# Patient Record
Sex: Female | Born: 1980 | Race: White | Hispanic: No | Marital: Single | Smoking: Never smoker
Health system: Southern US, Community
[De-identification: ages and names within clinical notes are randomized; demographics above are authoritative.]

## PROBLEM LIST (undated history)

## (undated) DIAGNOSIS — B977 Papillomavirus as the cause of diseases classified elsewhere: Secondary | ICD-10-CM

## (undated) DIAGNOSIS — R87619 Unspecified abnormal cytological findings in specimens from cervix uteri: Secondary | ICD-10-CM

## (undated) DIAGNOSIS — F32A Depression, unspecified: Secondary | ICD-10-CM

## (undated) DIAGNOSIS — Z309 Encounter for contraceptive management, unspecified: Secondary | ICD-10-CM

## (undated) DIAGNOSIS — Z3490 Encounter for supervision of normal pregnancy, unspecified, unspecified trimester: Secondary | ICD-10-CM

## (undated) DIAGNOSIS — F329 Major depressive disorder, single episode, unspecified: Secondary | ICD-10-CM

## (undated) DIAGNOSIS — F419 Anxiety disorder, unspecified: Secondary | ICD-10-CM

## (undated) DIAGNOSIS — Z8742 Personal history of other diseases of the female genital tract: Secondary | ICD-10-CM

## (undated) DIAGNOSIS — O219 Vomiting of pregnancy, unspecified: Secondary | ICD-10-CM

## (undated) HISTORY — PX: COLPOSCOPY: SHX161

## (undated) HISTORY — DX: Encounter for contraceptive management, unspecified: Z30.9

## (undated) HISTORY — DX: Papillomavirus as the cause of diseases classified elsewhere: B97.7

## (undated) HISTORY — DX: Anxiety disorder, unspecified: F41.9

## (undated) HISTORY — DX: Depression, unspecified: F32.A

## (undated) HISTORY — DX: Vomiting of pregnancy, unspecified: O21.9

## (undated) HISTORY — DX: Encounter for supervision of normal pregnancy, unspecified, unspecified trimester: Z34.90

## (undated) HISTORY — DX: Personal history of other diseases of the female genital tract: Z87.42

## (undated) HISTORY — DX: Unspecified abnormal cytological findings in specimens from cervix uteri: R87.619

## (undated) HISTORY — DX: Major depressive disorder, single episode, unspecified: F32.9

---

## 2004-05-13 ENCOUNTER — Ambulatory Visit: Payer: Self-pay

## 2011-03-08 ENCOUNTER — Emergency Department (HOSPITAL_COMMUNITY)
Admission: EM | Admit: 2011-03-08 | Discharge: 2011-03-08 | Disposition: A | Discharge status: Home / Self Care | Payer: Medicaid Other

## 2011-03-08 NOTE — ED Notes (Signed)
No answer to call x2 

## 2011-06-05 ENCOUNTER — Emergency Department (HOSPITAL_COMMUNITY)
Admission: EM | Admit: 2011-06-05 | Discharge: 2011-06-05 | Disposition: A | Discharge status: Home / Self Care | Payer: Medicaid Other | Attending: Emergency Medicine | Admitting: Emergency Medicine

## 2011-06-05 DIAGNOSIS — H6691 Otitis media, unspecified, right ear: Secondary | ICD-10-CM

## 2011-06-05 DIAGNOSIS — H6690 Otitis media, unspecified, unspecified ear: Secondary | ICD-10-CM | POA: Insufficient documentation

## 2011-06-05 MED ORDER — CEFTRIAXONE SODIUM 1 G IJ SOLR
1.0000 g | Freq: Once | INTRAMUSCULAR | Status: AC
  Administered 2011-06-05: 1 g via INTRAMUSCULAR
  Filled 2011-06-05: qty 10

## 2011-06-05 MED ORDER — AMOXICILLIN 500 MG PO CAPS: 500.0000 mg | ORAL_CAPSULE | Freq: Three times a day (TID) | ORAL | Status: AC

## 2011-06-05 NOTE — ED Provider Notes (Signed)
History     CSN: [id number] Arrival date & time: 06/05/2011  5:14 PM   First MD Initiated Contact with Patient 06/05/11 1757      Chief Complaint  Patient presents with  . Sore Throat    (Consider location/radiation/quality/duration/timing/severity/associated sxs/prior treatment) HPI Comments: sxs began with sore throat but now are primarily R ear pain.  Patient is a 30 y.o. female presenting with pharyngitis. The history is provided by the patient. No language interpreter was used.  Sore Throat This is a new problem. The current episode started in the past 7 days. The problem occurs constantly. The problem has been gradually worsening. Associated symptoms include coughing and a sore throat. Pertinent negatives include no chills, fever, nausea or vomiting. Associated symptoms comments: R ear pain. The symptoms are aggravated by coughing and swallowing. She has tried acetaminophen for the symptoms. The treatment provided no relief.    History reviewed. No pertinent past medical history.  History reviewed. No pertinent past surgical history.  No family history on file.  History  Substance Use Topics  . Smoking status: Never Smoker   . Smokeless tobacco: Not on file  . Alcohol Use: No    OB History    Grav Para Term Preterm Abortions TAB SAB Ect Mult Living                  Review of Systems  Constitutional: Negative for fever and chills.  HENT: Positive for ear pain and sore throat.   Respiratory: Positive for cough.   Gastrointestinal: Negative for nausea, vomiting and diarrhea.  All other systems reviewed and are negative.    Allergies  Review of patient's allergies indicates no known allergies.  Home Medications  No current outpatient prescriptions on file.  BP 132/78  Pulse 103  Temp(Src) 97.3 F (36.3 C) (Oral)  Resp 20  Ht 5\' 2"  (1.575 m)  Wt 138 lb (62.596 kg)  BMI 25.24 kg/m2  SpO2 100%  LMP 05/12/2011  Physical Exam  Nursing note and vitals  reviewed. Constitutional: She is oriented to person, place, and time. She appears well-developed and well-nourished. No distress.  HENT:  Head: Normocephalic and atraumatic.  Right Ear: External ear and ear canal normal. No drainage, swelling or tenderness. Tympanic membrane is injected and bulging. A middle ear effusion is present. Decreased hearing is noted.  Left Ear: Hearing, tympanic membrane, external ear and ear canal normal.  Mouth/Throat: No oropharyngeal exudate.       R TM is bulging and injected.  No landmarks are visible.  Hearing is decreased.  Eyes: EOM are normal.  Neck: Normal range of motion.  Cardiovascular: Normal rate, regular rhythm and normal heart sounds.   Pulmonary/Chest: Effort normal and breath sounds normal. No respiratory distress. She has no wheezes. She has no rales. She exhibits no tenderness.  Abdominal: Soft. She exhibits no distension. There is no tenderness.  Musculoskeletal: Normal range of motion.  Lymphadenopathy:    She has cervical adenopathy.  Neurological: She is alert and oriented to person, place, and time.  Skin: Skin is warm and dry.  Psychiatric: She has a normal mood and affect. Judgment normal.    ED Course  Procedures (including critical care time)  Labs Reviewed - No data to display No results found.   No diagnosis found.    MDM          Worthy Rancher, PA 06/05/11 [phone]

## 2011-06-05 NOTE — ED Notes (Signed)
Pt a/ox4. Resp even and unlabored. NAD at this time. D/C instructions and Rx x1 reviewed with pt. Pt verbalized understanding. Pt ambulated to POV with steady gate.  

## 2011-06-05 NOTE — ED Provider Notes (Signed)
Medical screening examination/treatment/procedure(s) were performed by non-physician practitioner and as supervising physician I was immediately available for consultation/collaboration.  Donnetta Hutching, MD 06/05/11 [phone]

## 2011-06-05 NOTE — ED Notes (Signed)
Pt c/o sore throat, r earache, and hoarseness since Monday.  Reports a little cough.  Unsure if has had fever.

## 2012-08-23 ENCOUNTER — Other Ambulatory Visit (HOSPITAL_COMMUNITY): Payer: Self-pay | Admitting: Urology

## 2012-08-23 DIAGNOSIS — N390 Urinary tract infection, site not specified: Secondary | ICD-10-CM

## 2012-08-25 ENCOUNTER — Ambulatory Visit (HOSPITAL_COMMUNITY)
Admission: RE | Admit: 2012-08-25 | Discharge: 2012-08-25 | Disposition: A | Discharge status: Home / Self Care | Payer: Medicaid Other | Source: Ambulatory Visit | Attending: Urology | Admitting: Urology

## 2012-08-25 DIAGNOSIS — N390 Urinary tract infection, site not specified: Secondary | ICD-10-CM | POA: Insufficient documentation

## 2013-07-31 ENCOUNTER — Ambulatory Visit (INDEPENDENT_AMBULATORY_CARE_PROVIDER_SITE_OTHER): Payer: Medicaid Other | Admitting: Adult Health

## 2013-07-31 ENCOUNTER — Encounter: Payer: Self-pay | Admitting: Adult Health

## 2013-07-31 ENCOUNTER — Other Ambulatory Visit (HOSPITAL_COMMUNITY)
Admission: RE | Admit: 2013-07-31 | Discharge: 2013-07-31 | Disposition: A | Discharge status: Home / Self Care | Payer: Medicaid Other | Source: Ambulatory Visit | Attending: Adult Health | Admitting: Adult Health

## 2013-07-31 VITALS — BP 116/72 | HR 74 | Ht 62.0 in | Wt 145.0 lb

## 2013-07-31 DIAGNOSIS — Z8742 Personal history of other diseases of the female genital tract: Secondary | ICD-10-CM

## 2013-07-31 DIAGNOSIS — Z1151 Encounter for screening for human papillomavirus (HPV): Secondary | ICD-10-CM | POA: Insufficient documentation

## 2013-07-31 DIAGNOSIS — Z309 Encounter for contraceptive management, unspecified: Secondary | ICD-10-CM

## 2013-07-31 DIAGNOSIS — Z01419 Encounter for gynecological examination (general) (routine) without abnormal findings: Secondary | ICD-10-CM

## 2013-07-31 DIAGNOSIS — R87810 Cervical high risk human papillomavirus (HPV) DNA test positive: Secondary | ICD-10-CM | POA: Insufficient documentation

## 2013-07-31 DIAGNOSIS — Z0000 Encounter for general adult medical examination without abnormal findings: Secondary | ICD-10-CM

## 2013-07-31 DIAGNOSIS — Z124 Encounter for screening for malignant neoplasm of cervix: Secondary | ICD-10-CM | POA: Insufficient documentation

## 2013-07-31 HISTORY — DX: Encounter for contraceptive management, unspecified: Z30.9

## 2013-07-31 HISTORY — DX: Personal history of other diseases of the female genital tract: Z87.42

## 2013-07-31 MED ORDER — NORGESTIM-ETH ESTRAD TRIPHASIC 0.18/0.215/0.25 MG-25 MCG PO TABS: 1.0000 | ORAL_TABLET | Freq: Every day | ORAL | Status: DC

## 2013-07-31 NOTE — Progress Notes (Signed)
Patient ID: Ronna PolioJennifer Remo, female   DOB: April 02, 1981, 33 y.o.   MRN: [id number] History of Present Illness: Victorino DikeJennifer is a 33 year old white female, single in for a pap and physical.   Current Medications, Allergies, Past Medical History, Past Surgical History, Family History and Social History were reviewed in [facility] electronic medical record.   Past Medical History  Diagnosis Date  . Abnormal Pap smear of cervix   . HPV (human papilloma virus) infection   . History of abnormal cervical Pap smear 07/31/2013  . Contraceptive management 07/31/2013   Past Surgical History  Procedure Laterality Date  . Colposcopy    Current outpatient prescriptions:Norgestimate-Ethinyl Estradiol Triphasic (ORTHO TRI-CYCLEN LO) 0.18/0.215/0.25 MG-25 MCG tab, Take 1 tablet by mouth daily., Disp: 1 Package, Rfl: 11  Review of Systems: Patient denies any  blurred vision, shortness of breath, chest pain, abdominal pain, problems with bowel movements, urination, or intercourse. Not having sex at present, no joint pain or mood changes, does have headaches often, this is chronic and she uses Maxalt if bad.She is happy with her OCs. Had abnormal pap in 2011 with colpo, showing HPV effect, no pap since.   Physical Exam:BP 116/72  Pulse 74  Ht 5\' 2"  (1.575 m)  Wt 145 lb (65.772 kg)  BMI 26.51 kg/m2  LMP 07/10/2013 General:  Well developed, well nourished, no acute distress Skin:  Warm and dry, tan with lots of tattoos Neck:  Midline trachea, normal thyroid Lungs; Clear to auscultation bilaterally Breast:  No dominant palpable mass, retraction, or nipple discharge Cardiovascular: Regular rate and rhythm Abdomen:  Soft, non tender, no hepatosplenomegaly Pelvic:  External genitalia is normal in appearance.  The vagina is normal in appearance. The cervix is bulbous and friable with EC brush, pap with HPV performed.  Uterus is felt to be normal size, shape, and contour.  No   adnexal masses or tenderness  noted. Extremities:  No swelling or varicosities noted Psych:  No mood changes, alert and cooperative, seems happy   Impression: Yearly gyn exam  History of abnormal pap Contraceptive management    Plan: Refilled ortho tri cyclen lo x 1 year Return in 1 year of physical Declines labs

## 2013-07-31 NOTE — Patient Instructions (Signed)
Physical in 1 year Follow up prn

## 2013-08-07 ENCOUNTER — Telehealth: Payer: Self-pay | Admitting: Adult Health

## 2013-08-07 NOTE — Telephone Encounter (Signed)
Pt aware pap came back abnormal and needs colpo, appt made

## 2013-08-15 ENCOUNTER — Encounter: Payer: Self-pay | Admitting: Obstetrics and Gynecology

## 2013-08-15 ENCOUNTER — Ambulatory Visit (INDEPENDENT_AMBULATORY_CARE_PROVIDER_SITE_OTHER): Payer: Medicaid Other | Admitting: Obstetrics and Gynecology

## 2013-08-15 ENCOUNTER — Other Ambulatory Visit: Payer: Self-pay | Admitting: Obstetrics and Gynecology

## 2013-08-15 VITALS — BP 110/76 | Ht 62.0 in | Wt 140.0 lb

## 2013-08-15 DIAGNOSIS — R87810 Cervical high risk human papillomavirus (HPV) DNA test positive: Secondary | ICD-10-CM

## 2013-08-15 DIAGNOSIS — N870 Mild cervical dysplasia: Secondary | ICD-10-CM

## 2013-08-15 NOTE — Patient Instructions (Signed)
Colposcopy Care After Colposcopy is a procedure in which a special tool is used to magnify the surface of the cervix. A tissue sample (biopsy) may also be taken. This sample will be looked at for cervical cancer or other problems. After the test:  You may have some cramping.  Lie down for a few minutes if you feel lightheaded.   You may have some bleeding which should stop in a few days. HOME CARE  Do not have sex or use tampons for 2 to 3 days or as told.  Only take medicine as told by your doctor.  Continue to take your birth control pills as usual. Finding out the results of your test Ask when your test results will be ready. Make sure you get your test results. GET HELP RIGHT AWAY IF:  You are bleeding a lot or are passing blood clots.  You develop a fever of 102 F (38.9 C) or higher.  You have abnormal vaginal discharge.  You have cramps that do not go away with medicine.  You feel lightheaded, dizzy, or pass out (faint). MAKE SURE YOU:   Understand these instructions.  Will watch your condition.  Will get help right away if you are not doing well or get worse. Document Released: 12/22/2007 Document Revised: 09/27/2011 Document Reviewed: 02/01/2013 ExitCare Patient Information 2014 [employer].  

## 2013-08-15 NOTE — Progress Notes (Signed)
This chart was scribed by Bennett Scrapehristina Taylor, Medical Scribe, for Dr. Christin BachJohn Pelagia Iacobucci on 08/15/13 at 11:07 AM. This chart was reviewed by Dr. Christin BachJohn Shaqueena Mauceri and is accurate.   Ronna PolioJennifer Labrecque 32 y.o. [id number] here for colposcopy for ASCUS with POSITIVE high risk HPV pap smear on 07/31/13. H/O one cervix cryotherapy and one cervical cauterization 6 years ago. G2P1A1, living child was a vaginal birth. She is unsure about having more children. Discussed role for HPV in cervical dysplasia, need for surveillance.  Patient given informed consent, signed copy in the chart, time out was performed.  Placed in lithotomy position. Cervix viewed with speculum and colposcope after application of acetic acid. Chaperone present. Pt handled procedure with no discomfort.   Colposcopy adequate? Yes  white punctation noted at 12 o'clock; biopsies obtained at 12 o'clock.   ECC specimen obtained. All specimens were labelled and sent to pathology.  Colposcopy IMPRESSION: CIN 1 anteriorly   Patient was given post procedure instructions. Will follow up pathology and manage accordingly.  Routine preventative health maintenance measures emphasized.

## 2013-08-16 ENCOUNTER — Encounter: Payer: Self-pay | Admitting: Obstetrics and Gynecology

## 2013-08-17 ENCOUNTER — Telehealth: Payer: Self-pay | Admitting: *Deleted

## 2013-08-17 NOTE — Telephone Encounter (Signed)
Message copied by Richardson ChiquitoRAVIS, Joshia Kitchings M on Fri Aug 17, 2013  1:36 PM ------      Message from: Tilda BurrowFERGUSON, JOHN V      Created: Thu Aug 16, 2013  5:19 PM       Pathology shows LOW GRADE CERVICAL ABNORMALITIES ,(LSIL), WHICH CAN BE FOLLOWED BY REPEAT PAP IN 1 YEAR. ------

## 2013-08-21 NOTE — Telephone Encounter (Signed)
Attempted to contact patient but voicemail not set up.

## 2013-08-24 NOTE — Telephone Encounter (Signed)
Pt aware to follow up in 1 year for repeat pap

## 2013-09-14 ENCOUNTER — Ambulatory Visit: Payer: Medicaid Other | Admitting: Obstetrics and Gynecology

## 2014-05-20 ENCOUNTER — Encounter: Payer: Self-pay | Admitting: Obstetrics and Gynecology

## 2014-08-26 ENCOUNTER — Other Ambulatory Visit: Payer: Self-pay | Admitting: *Deleted

## 2014-08-26 MED ORDER — NORGESTIM-ETH ESTRAD TRIPHASIC 0.18/0.215/0.25 MG-25 MCG PO TABS: 1.0000 | ORAL_TABLET | Freq: Every day | ORAL | Status: DC

## 2015-07-20 NOTE — L&D Delivery Note (Signed)
Delivery Note At 3:14 PM a viable female was delivered via Vaginal, Spontaneous Delivery (Presentation:vertex ;LOA  ).  APGAR: 8,9 ; weight  .   Placenta status:spont ,shultz .  Cord:3 vc  with the following complications:none .  Cord pH: n/a  Anesthesia:  epidural Episiotomy: None Lacerations: None Suture Repair: n/a Est. Blood Loss (mL): 50  Mom to postpartum.  Baby to Couplet care / Skin to Skin.  Susan Salinas 05/30/2016, 3:24 PM

## 2015-10-01 ENCOUNTER — Other Ambulatory Visit (HOSPITAL_COMMUNITY): Payer: Self-pay | Admitting: Sports Medicine

## 2015-10-01 DIAGNOSIS — R1013 Epigastric pain: Secondary | ICD-10-CM

## 2015-10-06 ENCOUNTER — Ambulatory Visit (HOSPITAL_COMMUNITY): Admission: RE | Admit: 2015-10-06 | Payer: Medicaid Other | Source: Ambulatory Visit

## 2015-10-08 ENCOUNTER — Ambulatory Visit (INDEPENDENT_AMBULATORY_CARE_PROVIDER_SITE_OTHER): Payer: Medicaid Other | Admitting: Adult Health

## 2015-10-08 ENCOUNTER — Encounter: Payer: Self-pay | Admitting: Adult Health

## 2015-10-08 VITALS — BP 118/80 | HR 80 | Ht 61.5 in | Wt 139.0 lb

## 2015-10-08 DIAGNOSIS — N926 Irregular menstruation, unspecified: Secondary | ICD-10-CM | POA: Diagnosis not present

## 2015-10-08 DIAGNOSIS — Z3201 Encounter for pregnancy test, result positive: Secondary | ICD-10-CM

## 2015-10-08 DIAGNOSIS — O219 Vomiting of pregnancy, unspecified: Secondary | ICD-10-CM

## 2015-10-08 DIAGNOSIS — Z3490 Encounter for supervision of normal pregnancy, unspecified, unspecified trimester: Secondary | ICD-10-CM

## 2015-10-08 DIAGNOSIS — F32A Depression, unspecified: Secondary | ICD-10-CM

## 2015-10-08 DIAGNOSIS — O3680X0 Pregnancy with inconclusive fetal viability, not applicable or unspecified: Secondary | ICD-10-CM

## 2015-10-08 DIAGNOSIS — F329 Major depressive disorder, single episode, unspecified: Secondary | ICD-10-CM | POA: Diagnosis not present

## 2015-10-08 HISTORY — DX: Encounter for supervision of normal pregnancy, unspecified, unspecified trimester: Z34.90

## 2015-10-08 HISTORY — DX: Vomiting of pregnancy, unspecified: O21.9

## 2015-10-08 LAB — POCT URINE PREGNANCY: PREG TEST UR: POSITIVE — AB

## 2015-10-08 MED ORDER — PRENATAL PLUS 27-1 MG PO TABS: 1.0000 | ORAL_TABLET | Freq: Every day | ORAL | Status: DC

## 2015-10-08 MED ORDER — DOXYLAMINE-PYRIDOXINE 10-10 MG PO TBEC: DELAYED_RELEASE_TABLET | ORAL | Status: DC

## 2015-10-08 NOTE — Progress Notes (Signed)
Subjective:     Patient ID: Susan PolioJennifer Salinas, female   DOB: Apr 22, 1981, 35 y.o.   MRN: [id number]  HPI Victorino DikeJennifer is a 35 year old white female in for UPT, has nausea and some vomiting. She is on Luvox and has been on meds for depression since she was 16.   Review of Systems Patient denies any headaches, hearing loss, fatigue, blurred vision, shortness of breath, chest pain, abdominal pain, problems with bowel movements, urination, or intercourse. No joint pain or mood swings.See HPI for positives. Reviewed past medical,surgical, social and family history. Reviewed medications and allergies.     Objective:   Physical Exam BP 118/80 mmHg  Pulse 80  Ht 5' 1.5" (1.562 m)  Wt 139 lb (63.05 kg)  BMI 25.84 kg/m2  LMP 09/02/2015 UPT +, about 5+1 week by LMP with EDD 06/08/16.Skin warm and dry. Neck: mid line trachea, normal thyroid, good ROM, no lymphadenopathy noted. Lungs: clear to ausculation bilaterally. Cardiovascular: regular rate and rhythm.Abdomen soft and non tender, discussed with Dr Emelda FearFerguson that she is on Luvox and he said can continue for now, it is cat.C, and then D in third trimester.Will give diclegis to try.    Assessment:     +UPT Pregnant Nausea and vomiting Depression     Plan:    Eat often, try gum and hard candy  Rx prenatal plus #30 take 1 daily with 11 refills Rx diclegis 10 mg #48 take as directed, samples given lot [id number] exp 04-17-17 Review handout on first trimester Can take luvox for now, but will change to other SSRI at about 20 weeks

## 2015-10-08 NOTE — Patient Instructions (Signed)
First Trimester of Pregnancy The first trimester of pregnancy is from week 1 until the end of week 12 (months 1 through 3). A week after a sperm fertilizes an egg, the egg will implant on the wall of the uterus. This embryo will begin to develop into a baby. Genes from you and your partner are forming the baby. The female genes determine whether the baby is a boy or a girl. At 6-8 weeks, the eyes and face are formed, and the heartbeat can be seen on ultrasound. At the end of 12 weeks, all the baby's organs are formed.  Now that you are pregnant, you will want to do everything you can to have a healthy baby. Two of the most important things are to get good prenatal care and to follow your health care provider's instructions. Prenatal care is all the medical care you receive before the baby's birth. This care will help prevent, find, and treat any problems during the pregnancy and childbirth. BODY CHANGES Your body goes through many changes during pregnancy. The changes vary from woman to woman.   You may gain or lose a couple of pounds at first.  You may feel sick to your stomach (nauseous) and throw up (vomit). If the vomiting is uncontrollable, call your health care provider.  You may tire easily.  You may develop headaches that can be relieved by medicines approved by your health care provider.  You may urinate more often. Painful urination may mean you have a bladder infection.  You may develop heartburn as a result of your pregnancy.  You may develop constipation because certain hormones are causing the muscles that push waste through your intestines to slow down.  You may develop hemorrhoids or swollen, bulging veins (varicose veins).  Your breasts may begin to grow larger and become tender. Your nipples may stick out more, and the tissue that surrounds them (areola) may become darker.  Your gums may bleed and may be sensitive to brushing and flossing.  Dark spots or blotches (chloasma,  mask of pregnancy) may develop on your face. This will likely fade after the baby is born.  Your menstrual periods will stop.  You may have a loss of appetite.  You may develop cravings for certain kinds of food.  You may have changes in your emotions from day to day, such as being excited to be pregnant or being concerned that something may go wrong with the pregnancy and baby.  You may have more vivid and strange dreams.  You may have changes in your hair. These can include thickening of your hair, rapid growth, and changes in texture. Some women also have hair loss during or after pregnancy, or hair that feels dry or thin. Your hair will most likely return to normal after your baby is born. WHAT TO EXPECT AT YOUR PRENATAL VISITS During a routine prenatal visit:  You will be weighed to make sure you and the baby are growing normally.  Your blood pressure will be taken.  Your abdomen will be measured to track your baby's growth.  The fetal heartbeat will be listened to starting around week 10 or 12 of your pregnancy.  Test results from any previous visits will be discussed. Your health care provider may ask you:  How you are feeling.  If you are feeling the baby move.  If you have had any abnormal symptoms, such as leaking fluid, bleeding, severe headaches, or abdominal cramping.  If you are using any tobacco products,   including cigarettes, chewing tobacco, and electronic cigarettes.  If you have any questions. Other tests that may be performed during your first trimester include:  Blood tests to find your blood type and to check for the presence of any previous infections. They will also be used to check for low iron levels (anemia) and Rh antibodies. Later in the pregnancy, blood tests for diabetes will be done along with other tests if problems develop.  Urine tests to check for infections, diabetes, or protein in the urine.  An ultrasound to confirm the proper growth  and development of the baby.  An amniocentesis to check for possible genetic problems.  Fetal screens for spina bifida and Down syndrome.  You may need other tests to make sure you and the baby are doing well.  HIV (human immunodeficiency virus) testing. Routine prenatal testing includes screening for HIV, unless you choose not to have this test. HOME CARE INSTRUCTIONS  Medicines  Follow your health care provider's instructions regarding medicine use. Specific medicines may be either safe or unsafe to take during pregnancy.  Take your prenatal vitamins as directed.  If you develop constipation, try taking a stool softener if your health care provider approves. Diet  Eat regular, well-balanced meals. Choose a variety of foods, such as meat or vegetable-based protein, fish, milk and low-fat dairy products, vegetables, fruits, and whole grain breads and cereals. Your health care provider will help you determine the amount of weight gain that is right for you.  Avoid raw meat and uncooked cheese. These carry germs that can cause birth defects in the baby.  Eating four or five small meals rather than three large meals a day may help relieve nausea and vomiting. If you start to feel nauseous, eating a few soda crackers can be helpful. Drinking liquids between meals instead of during meals also seems to help nausea and vomiting.  If you develop constipation, eat more high-fiber foods, such as fresh vegetables or fruit and whole grains. Drink enough fluids to keep your urine clear or pale yellow. Activity and Exercise  Exercise only as directed by your health care provider. Exercising will help you:  Control your weight.  Stay in shape.  Be prepared for labor and delivery.  Experiencing pain or cramping in the lower abdomen or low back is a good sign that you should stop exercising. Check with your health care provider before continuing normal exercises.  Try to avoid standing for long  periods of time. Move your legs often if you must stand in one place for a long time.  Avoid heavy lifting.  Wear low-heeled shoes, and practice good posture.  You may continue to have sex unless your health care provider directs you otherwise. Relief of Pain or Discomfort  Wear a good support bra for breast tenderness.   Take warm sitz baths to soothe any pain or discomfort caused by hemorrhoids. Use hemorrhoid cream if your health care provider approves.   Rest with your legs elevated if you have leg cramps or low back pain.  If you develop varicose veins in your legs, wear support hose. Elevate your feet for 15 minutes, 3-4 times a day. Limit salt in your diet. Prenatal Care  Schedule your prenatal visits by the twelfth week of pregnancy. They are usually scheduled monthly at first, then more often in the last 2 months before delivery.  Write down your questions. Take them to your prenatal visits.  Keep all your prenatal visits as directed by your   health care provider. Safety  Wear your seat belt at all times when driving.  Make a list of emergency phone numbers, including numbers for family, friends, the hospital, and police and fire departments. General Tips  Ask your health care provider for a referral to a local prenatal education class. Begin classes no later than at the beginning of month 6 of your pregnancy.  Ask for help if you have counseling or nutritional needs during pregnancy. Your health care provider can offer advice or refer you to specialists for help with various needs.  Do not use hot tubs, steam rooms, or saunas.  Do not douche or use tampons or scented sanitary pads.  Do not cross your legs for long periods of time.  Avoid cat litter boxes and soil used by cats. These carry germs that can cause birth defects in the baby and possibly loss of the fetus by miscarriage or stillbirth.  Avoid all smoking, herbs, alcohol, and medicines not prescribed by  your health care provider. Chemicals in these affect the formation and growth of the baby.  Do not use any tobacco products, including cigarettes, chewing tobacco, and electronic cigarettes. If you need help quitting, ask your health care provider. You may receive counseling support and other resources to help you quit.  Schedule a dentist appointment. At home, brush your teeth with a soft toothbrush and be gentle when you floss. SEEK MEDICAL CARE IF:   You have dizziness.  You have mild pelvic cramps, pelvic pressure, or nagging pain in the abdominal area.  You have persistent nausea, vomiting, or diarrhea.  You have a bad smelling vaginal discharge.  You have pain with urination.  You notice increased swelling in your face, hands, legs, or ankles. SEEK IMMEDIATE MEDICAL CARE IF:   You have a fever.  You are leaking fluid from your vagina.  You have spotting or bleeding from your vagina.  You have severe abdominal cramping or pain.  You have rapid weight gain or loss.  You vomit blood or material that looks like coffee grounds.  You are exposed to [place] measles and have never had them.  You are exposed to fifth disease or chickenpox.  You develop a severe headache.  You have shortness of breath.  You have any kind of trauma, such as from a fall or a car accident.   This information is not intended to replace advice given to you by your health care provider. Make sure you discuss any questions you have with your health care provider.   Document Released: 06/29/2001 Document Revised: 07/26/2014 Document Reviewed: 05/15/2013 Elsevier Interactive Patient Education 2016 [employer] Follow up in 2 weeks for [place] Eat often, try gum or hard candy Try diclegis

## 2015-10-22 ENCOUNTER — Other Ambulatory Visit (INDEPENDENT_AMBULATORY_CARE_PROVIDER_SITE_OTHER): Payer: Medicaid Other

## 2015-10-22 ENCOUNTER — Ambulatory Visit (INDEPENDENT_AMBULATORY_CARE_PROVIDER_SITE_OTHER): Payer: Medicaid Other

## 2015-10-22 DIAGNOSIS — O3680X0 Pregnancy with inconclusive fetal viability, not applicable or unspecified: Secondary | ICD-10-CM | POA: Diagnosis not present

## 2015-10-22 DIAGNOSIS — R3915 Urgency of urination: Secondary | ICD-10-CM | POA: Diagnosis not present

## 2015-10-22 LAB — POCT URINALYSIS DIPSTICK
Glucose, UA: NEGATIVE
Ketones, UA: NEGATIVE
LEUKOCYTES UA: NEGATIVE
Nitrite, UA: POSITIVE
PROTEIN UA: NEGATIVE

## 2015-10-22 MED ORDER — CEPHALEXIN 500 MG PO CAPS: 500.0000 mg | ORAL_CAPSULE | Freq: Three times a day (TID) | ORAL | Status: DC

## 2015-10-22 NOTE — Addendum Note (Signed)
Addended by: Gaylyn RongEVANS, Shontay Wallner A on: 10/22/2015 04:38 PM   Modules accepted: Orders

## 2015-10-22 NOTE — Progress Notes (Signed)
+   nitrites.  Rx Keflex 500mg  BID X7

## 2015-10-22 NOTE — Progress Notes (Signed)
US 7+1 wks,single IUP pos fht 119 bpm,normal ov's bilat,crl 9.3 mm

## 2015-10-24 LAB — URINE CULTURE

## 2015-11-05 ENCOUNTER — Ambulatory Visit (INDEPENDENT_AMBULATORY_CARE_PROVIDER_SITE_OTHER): Payer: Medicaid Other | Admitting: Advanced Practice Midwife

## 2015-11-05 ENCOUNTER — Other Ambulatory Visit (HOSPITAL_COMMUNITY)
Admission: RE | Admit: 2015-11-05 | Discharge: 2015-11-05 | Disposition: A | Discharge status: Home / Self Care | Payer: Medicaid Other | Source: Ambulatory Visit | Attending: Advanced Practice Midwife | Admitting: Advanced Practice Midwife

## 2015-11-05 ENCOUNTER — Encounter: Payer: Self-pay | Admitting: Advanced Practice Midwife

## 2015-11-05 VITALS — BP 100/70 | HR 72 | Wt 141.0 lb

## 2015-11-05 DIAGNOSIS — Z1151 Encounter for screening for human papillomavirus (HPV): Secondary | ICD-10-CM | POA: Insufficient documentation

## 2015-11-05 DIAGNOSIS — Z331 Pregnant state, incidental: Secondary | ICD-10-CM

## 2015-11-05 DIAGNOSIS — Z3491 Encounter for supervision of normal pregnancy, unspecified, first trimester: Secondary | ICD-10-CM | POA: Diagnosis not present

## 2015-11-05 DIAGNOSIS — Z0283 Encounter for blood-alcohol and blood-drug test: Secondary | ICD-10-CM

## 2015-11-05 DIAGNOSIS — Z3682 Encounter for antenatal screening for nuchal translucency: Secondary | ICD-10-CM

## 2015-11-05 DIAGNOSIS — N870 Mild cervical dysplasia: Secondary | ICD-10-CM

## 2015-11-05 DIAGNOSIS — Z01419 Encounter for gynecological examination (general) (routine) without abnormal findings: Secondary | ICD-10-CM | POA: Diagnosis present

## 2015-11-05 DIAGNOSIS — Z369 Encounter for antenatal screening, unspecified: Secondary | ICD-10-CM

## 2015-11-05 DIAGNOSIS — Z1389 Encounter for screening for other disorder: Secondary | ICD-10-CM

## 2015-11-05 DIAGNOSIS — Z124 Encounter for screening for malignant neoplasm of cervix: Secondary | ICD-10-CM

## 2015-11-05 LAB — POCT URINALYSIS DIPSTICK
GLUCOSE UA: NEGATIVE
Ketones, UA: NEGATIVE
LEUKOCYTES UA: NEGATIVE
NITRITE UA: NEGATIVE
Protein, UA: NEGATIVE
RBC UA: NEGATIVE

## 2015-11-05 MED ORDER — DOXYLAMINE-PYRIDOXINE 10-10 MG PO TBEC: DELAYED_RELEASE_TABLET | ORAL | Status: DC

## 2015-11-05 NOTE — Progress Notes (Signed)
  Subjective:    Susan PolioJennifer Salinas is a G2P1001 5574w1d being seen today for her first obstetrical visit.  Her obstetrical history is significant for 1 term SVD w/o problems.  Pregnancy history fully reviewed.  Patient reports nausea. Diclegis samples help a lot  Filed Vitals:   11/05/15 1058  BP: 100/70  Pulse: 72  Weight: 141 lb (63.957 kg)    HISTORY: OB History  Gravida Para Term Preterm AB SAB TAB Ectopic Multiple Living  2 1 1   0  0   1    # Outcome Date GA Lbr Len/2nd Weight Sex Delivery Anes PTL Lv  2 Current           1 Term 06/23/06 4581w0d  7 lb 14 oz (3.572 kg) M Vag-Spont   Y     Past Medical History  Diagnosis Date  . Abnormal Pap smear of cervix   . HPV (human papilloma virus) infection   . History of abnormal cervical Pap smear 07/31/2013  . Contraceptive management 07/31/2013  . Depression   . Pregnant 10/08/2015  . Nausea and vomiting during pregnancy 10/08/2015   Past Surgical History  Procedure Laterality Date  . Colposcopy     Family History  Problem Relation Age of Onset  . COPD Paternal Grandmother      Exam       Pelvic Exam:    Perineum: Normal Perineum   Vulva: normal   Vagina:  normal mucosa, normal discharge, no palpable nodules   Uterus Normal, Gravid, FH: 9     Cervix: Normal..pap collected   Adnexa: Not palpable   Urinary:  urethral meatus normal    System:     Skin: normal coloration and turgor, no rashes    Neurologic: oriented, normal, normal mood   Extremities: normal strength, tone, and muscle mass   HEENT PERRLA   Mouth/Teeth mucous membranes moist, normal dentition   Neck supple and no masses   Cardiovascular: regular rate and rhythm   Respiratory:  appears well, vitals normal, no respiratory distress, acyanotic   Abdomen: soft, non-tender;  FHR: 160 US          Assessment:    Pregnancy: G2P1001 Patient Active Problem List   Diagnosis Date Noted  . Pregnant 10/08/2015  . Nausea and vomiting during pregnancy  10/08/2015  . Depression 10/08/2015  . Dysplasia of cervix, low grade (CIN 1) 08/15/2013  . History of abnormal cervical Pap smear 07/31/2013  . Contraceptive management 07/31/2013        Plan:     Initial labs (lab unable to get any blood--will collect next visit) Continue prenatal vitamins  Problem list reviewed and updated  Reviewed n/v relief measures and warning s/s to report  Diclegis rx and prior auth sent CCNC done Reviewed recommended weight gain based on pre-gravid BMI  Encouraged well-balanced diet Genetic Screening discussed Integrated Screen: requested.  Ultrasound discussed; fetal survey: requested.  Return in about 3 weeks (around 11/26/2015) for LROB, US:NT+1st IT.  CRESENZO-DISHMAN,Breslin Burklow 11/05/2015

## 2015-11-05 NOTE — Patient Instructions (Signed)

## 2015-11-07 LAB — CYTOLOGY - PAP

## 2015-11-12 ENCOUNTER — Encounter: Payer: Self-pay | Admitting: Advanced Practice Midwife

## 2015-11-18 ENCOUNTER — Other Ambulatory Visit: Payer: Self-pay | Admitting: Advanced Practice Midwife

## 2015-11-26 ENCOUNTER — Ambulatory Visit (INDEPENDENT_AMBULATORY_CARE_PROVIDER_SITE_OTHER): Payer: Medicaid Other

## 2015-11-26 ENCOUNTER — Ambulatory Visit (INDEPENDENT_AMBULATORY_CARE_PROVIDER_SITE_OTHER): Payer: Medicaid Other | Admitting: Women's Health

## 2015-11-26 ENCOUNTER — Encounter: Payer: Self-pay | Admitting: Women's Health

## 2015-11-26 VITALS — BP 118/74 | HR 80 | Wt 144.0 lb

## 2015-11-26 DIAGNOSIS — B977 Papillomavirus as the cause of diseases classified elsewhere: Secondary | ICD-10-CM

## 2015-11-26 DIAGNOSIS — F329 Major depressive disorder, single episode, unspecified: Secondary | ICD-10-CM

## 2015-11-26 DIAGNOSIS — O360110 Maternal care for anti-D [Rh] antibodies, first trimester, not applicable or unspecified: Secondary | ICD-10-CM

## 2015-11-26 DIAGNOSIS — Z3491 Encounter for supervision of normal pregnancy, unspecified, first trimester: Secondary | ICD-10-CM

## 2015-11-26 DIAGNOSIS — Z36 Encounter for antenatal screening of mother: Secondary | ICD-10-CM

## 2015-11-26 DIAGNOSIS — Z369 Encounter for antenatal screening, unspecified: Secondary | ICD-10-CM

## 2015-11-26 DIAGNOSIS — Z3682 Encounter for antenatal screening for nuchal translucency: Secondary | ICD-10-CM

## 2015-11-26 DIAGNOSIS — Z6791 Unspecified blood type, Rh negative: Secondary | ICD-10-CM | POA: Insufficient documentation

## 2015-11-26 DIAGNOSIS — O26899 Other specified pregnancy related conditions, unspecified trimester: Secondary | ICD-10-CM

## 2015-11-26 DIAGNOSIS — Z331 Pregnant state, incidental: Secondary | ICD-10-CM

## 2015-11-26 DIAGNOSIS — Z1389 Encounter for screening for other disorder: Secondary | ICD-10-CM

## 2015-11-26 DIAGNOSIS — F32A Depression, unspecified: Secondary | ICD-10-CM

## 2015-11-26 LAB — POCT URINALYSIS DIPSTICK
GLUCOSE UA: NEGATIVE
Ketones, UA: NEGATIVE
Leukocytes, UA: NEGATIVE
Nitrite, UA: NEGATIVE
Protein, UA: NEGATIVE
RBC UA: NEGATIVE

## 2015-11-26 NOTE — Progress Notes (Signed)
US 12+1 wks,measurements c/w dates,normal ov's bilat,NB present,NT 1.1 mm,crl 55.6 mm,fhr 158 bpm,ant pl gr 0

## 2015-11-26 NOTE — Progress Notes (Signed)
Low-risk OB appointment G2P1001 4332w1d Estimated Date of Delivery: 06/08/16 BP 118/74 mmHg  Pulse 80  Wt 144 lb (65.318 kg)  LMP 09/02/2015  BP, weight, and urine reviewed.  Refer to obstetrical flow sheet for FH & FHR.  No fm yet. Denies cramping, lof, vb, or uti s/s. No complaints. Reviewed today's normal nt u/s, warning s/s to report. Plan:  Continue routine obstetrical care  F/U in 4wks for OB appointment and 2nd IT 1st IT/NT today, urine cx and uds- couldn't do at new ob visit

## 2015-11-26 NOTE — Patient Instructions (Signed)

## 2015-11-27 LAB — RPR: RPR Ser Ql: NONREACTIVE

## 2015-11-27 LAB — URINALYSIS, ROUTINE W REFLEX MICROSCOPIC
BILIRUBIN UA: NEGATIVE
Glucose, UA: NEGATIVE
Ketones, UA: NEGATIVE
LEUKOCYTES UA: NEGATIVE
Nitrite, UA: NEGATIVE
PH UA: 5.5 (ref 5.0–7.5)
PROTEIN UA: NEGATIVE
RBC UA: NEGATIVE
Specific Gravity, UA: 1.028 (ref 1.005–1.030)
Urobilinogen, Ur: 0.2 mg/dL (ref 0.2–1.0)

## 2015-11-27 LAB — SICKLE CELL SCREEN: Sickle Cell Screen: NEGATIVE

## 2015-11-27 LAB — CBC
HEMATOCRIT: 38.1 % (ref 34.0–46.6)
Hemoglobin: 12.7 g/dL (ref 11.1–15.9)
MCH: 29.7 pg (ref 26.6–33.0)
MCHC: 33.3 g/dL (ref 31.5–35.7)
MCV: 89 fL (ref 79–97)
Platelets: 241 10*3/uL (ref 150–379)
RBC: 4.27 x10E6/uL (ref 3.77–5.28)
RDW: 13.6 % (ref 12.3–15.4)
WBC: 8.3 10*3/uL (ref 3.4–10.8)

## 2015-11-27 LAB — PMP SCREEN PROFILE (10S), URINE
Amphetamine Screen, Ur: NEGATIVE ng/mL
BARBITURATE SCRN UR: NEGATIVE ng/mL
BENZODIAZEPINE SCREEN, URINE: NEGATIVE ng/mL
CANNABINOIDS UR QL SCN: NEGATIVE ng/mL
CREATININE(CRT), U: 121.8 mg/dL (ref 20.0–300.0)
Cocaine(Metab.)Screen, Urine: NEGATIVE ng/mL
Methadone Scn, Ur: NEGATIVE ng/mL
OPIATE SCRN UR: NEGATIVE ng/mL
Oxycodone+Oxymorphone Ur Ql Scn: NEGATIVE ng/mL
PCP Scrn, Ur: NEGATIVE ng/mL
PH UR, DRUG SCRN: 6.6 (ref 4.5–8.9)
Propoxyphene, Screen: NEGATIVE ng/mL

## 2015-11-27 LAB — HEPATITIS B SURFACE ANTIGEN: Hepatitis B Surface Ag: NEGATIVE

## 2015-11-27 LAB — CYSTIC FIBROSIS MUTATION 97: GENE DIS ANAL CARRIER INTERP BLD/T-IMP: NOT DETECTED

## 2015-11-27 LAB — RUBELLA SCREEN: Rubella Antibodies, IGG: 6.74 index (ref >0.99)

## 2015-11-27 LAB — VARICELLA ZOSTER ANTIBODY, IGG: VARICELLA: 705 {index} (ref >165)

## 2015-11-27 LAB — ANTIBODY SCREEN: ANTIBODY SCREEN: NEGATIVE

## 2015-11-27 LAB — ABO/RH: RH TYPE: NEGATIVE

## 2015-11-28 LAB — MATERNAL SCREEN, INTEGRATED #1
Crown Rump Length: 55.6 mm
Gest. Age on Collection Date: 12.1 weeks
MATERNAL AGE AT EDD: 33.7 a
NUCHAL TRANSLUCENCY (NT): 1.1 mm
Number of Fetuses: 1
PAPP-A VALUE: 981.6 ng/mL
Weight: 144 [lb_av]

## 2015-11-28 LAB — URINE CULTURE: ORGANISM ID, BACTERIA: NO GROWTH

## 2015-12-24 ENCOUNTER — Encounter: Payer: Self-pay | Admitting: Obstetrics and Gynecology

## 2015-12-24 ENCOUNTER — Ambulatory Visit (INDEPENDENT_AMBULATORY_CARE_PROVIDER_SITE_OTHER): Payer: Medicaid Other | Admitting: Obstetrics and Gynecology

## 2015-12-24 VITALS — BP 100/70 | HR 78 | Wt 150.0 lb

## 2015-12-24 DIAGNOSIS — Z3491 Encounter for supervision of normal pregnancy, unspecified, first trimester: Secondary | ICD-10-CM

## 2015-12-24 DIAGNOSIS — Z369 Encounter for antenatal screening, unspecified: Secondary | ICD-10-CM

## 2015-12-24 DIAGNOSIS — Z331 Pregnant state, incidental: Secondary | ICD-10-CM

## 2015-12-24 DIAGNOSIS — Z1389 Encounter for screening for other disorder: Secondary | ICD-10-CM

## 2015-12-24 LAB — POCT URINALYSIS DIPSTICK
Blood, UA: NEGATIVE
Glucose, UA: NEGATIVE
KETONES UA: NEGATIVE
Leukocytes, UA: NEGATIVE
Nitrite, UA: NEGATIVE
PROTEIN UA: NEGATIVE

## 2015-12-24 NOTE — Progress Notes (Signed)
Patient ID: Ronna PolioJennifer Viney, female   DOB: 07/13/1981, 35 y.o.   MRN: [id number]  G2P1001 4624w1d Estimated Date of Delivery: 06/08/16  Blood pressure 100/70, pulse 78, weight 150 lb (68.04 kg), last menstrual period 09/02/2015.   refer to the ob flow sheet for FH and FHR, also BP, Wt, Urine results:notable for negative  Patient reports  + good fetal movement, denies any bleeding and no rupture of membranes symptoms or regular contractions. Patient complaints: None. Patient states she has gained 12 lbs since her pregnancy began. She states her eating habits are unchanged. Patient states she wants this to be her last pregnancy.   FHR: 145 bpm FH: U-4  Questions were answered. Assessment: LROB G2P1001 @ 8624w1d    Pt reports weight gain of 12 lbs since pregnancy.  Plan:  Continued routine obstetrical care,   Physical activity strongly encouraged.  F/u in 4 weeks for pnx care.anatomy u/s    By signing my name below, I, Ronney LionSuzanne Le, attest that this documentation has been prepared under the direction and in the presence of Tilda BurrowJohn Dail Lerew V, MD. Electronically Signed: Ronney LionSuzanne Le, ED Scribe. 12/24/2015. 9:48 AM.  I personally performed the services described in this documentation, which was SCRIBED in my presence. The recorded information has been reviewed and considered accurate. It has been edited as necessary during review. Tilda BurrowFERGUSON,Krissi Willaims V, MD

## 2015-12-27 LAB — MATERNAL SCREEN, INTEGRATED #2
ADSF: 1.02
AFP MARKER: 34.4 ng/mL
AFP MoM: 1.09
Crown Rump Length: 55.6 mm
DIA MOM: 0.97
DIA Value: 171.5 pg/mL
ESTRIOL UNCONJUGATED: 0.85 ng/mL
GEST. AGE ON COLLECTION DATE: 12.1 wk
GESTATIONAL AGE: 16.1 wk
HCG MOM: 1.38
Maternal Age at EDD: 33.7 years
NUCHAL TRANSLUCENCY MOM: 0.76
NUMBER OF FETUSES: 1
Nuchal Translucency (NT): 1.1 mm
PAPP-A MoM: 1.12
PAPP-A Value: 981.6 ng/mL
Test Results:: NEGATIVE
Weight: 144 [lb_av]
Weight: 150 [lb_av]
hCG Value: 48.4 IU/mL

## 2016-01-16 ENCOUNTER — Other Ambulatory Visit: Payer: Self-pay | Admitting: Obstetrics and Gynecology

## 2016-01-16 DIAGNOSIS — Z1389 Encounter for screening for other disorder: Secondary | ICD-10-CM

## 2016-01-21 ENCOUNTER — Ambulatory Visit (INDEPENDENT_AMBULATORY_CARE_PROVIDER_SITE_OTHER): Payer: Medicaid Other

## 2016-01-21 ENCOUNTER — Ambulatory Visit (INDEPENDENT_AMBULATORY_CARE_PROVIDER_SITE_OTHER): Payer: Medicaid Other | Admitting: Women's Health

## 2016-01-21 ENCOUNTER — Encounter: Payer: Self-pay | Admitting: Women's Health

## 2016-01-21 VITALS — BP 100/70 | HR 78 | Wt 151.0 lb

## 2016-01-21 DIAGNOSIS — Z3A21 21 weeks gestation of pregnancy: Secondary | ICD-10-CM | POA: Diagnosis not present

## 2016-01-21 DIAGNOSIS — Z331 Pregnant state, incidental: Secondary | ICD-10-CM

## 2016-01-21 DIAGNOSIS — Z3492 Encounter for supervision of normal pregnancy, unspecified, second trimester: Secondary | ICD-10-CM

## 2016-01-21 DIAGNOSIS — Z36 Encounter for antenatal screening of mother: Secondary | ICD-10-CM | POA: Diagnosis not present

## 2016-01-21 DIAGNOSIS — Z1389 Encounter for screening for other disorder: Secondary | ICD-10-CM

## 2016-01-21 DIAGNOSIS — B977 Papillomavirus as the cause of diseases classified elsewhere: Secondary | ICD-10-CM

## 2016-01-21 LAB — POCT URINALYSIS DIPSTICK
GLUCOSE UA: NEGATIVE
KETONES UA: NEGATIVE
Leukocytes, UA: NEGATIVE
Nitrite, UA: NEGATIVE
Protein, UA: NEGATIVE
RBC UA: NEGATIVE

## 2016-01-21 NOTE — Patient Instructions (Signed)
[place] Pediatricians/Family Doctors:  [facility] [phone]            [facility] [phone]                 [facility] [phone] (usually not accepting new patients unless you have family there already, you are always welcome to call and ask)            [facility] ([address] [place]) [phone]   [place] Pediatricians/Family Doctors:   [facility]: [phone]  [facility]: [phone]   Second Trimester of Pregnancy The second trimester is from week 13 through week 28, months 4 through 6. The second trimester is often a time when you feel your best. Your body has also adjusted to being pregnant, and you begin to feel better physically. Usually, morning sickness has lessened or quit completely, you may have more energy, and you may have an increase in appetite. The second trimester is also a time when the fetus is growing rapidly. At the end of the sixth month, the fetus is about 9 inches long and weighs about 1 pounds. You will likely begin to feel the baby move (quickening) between 18 and 20 weeks of the pregnancy. BODY CHANGES Your body goes through many changes during pregnancy. The changes vary from woman to woman.  2. Your weight will continue to increase. You will notice your lower abdomen bulging out. 3. You may begin to get stretch marks on your hips, abdomen, and breasts. 4. You may develop headaches that can be relieved by medicines approved by your health care provider. 5. You may urinate more often because the fetus is pressing on your bladder. 6. You may develop or continue to have heartburn as a result of your pregnancy. 7. You may develop constipation because certain hormones are causing the muscles that push waste through your intestines to slow down. 8. You may develop hemorrhoids or swollen, bulging veins (varicose veins). 9. You may have back pain because of the  weight gain and pregnancy hormones relaxing your joints between the bones in your pelvis and as a result of a shift in weight and the muscles that support your balance. 10. Your breasts will continue to grow and be tender. 11. Your gums may bleed and may be sensitive to brushing and flossing. 12. Dark spots or blotches (chloasma, mask of pregnancy) may develop on your face. This will likely fade after the baby is born. 13. A dark line from your belly button to the pubic area (linea nigra) may appear. This will likely fade after the baby is born. 14. You may have changes in your hair. These can include thickening of your hair, rapid growth, and changes in texture. Some women also have hair loss during or after pregnancy, or hair that feels dry or thin. Your hair will most likely return to normal after your baby is born. WHAT TO EXPECT AT YOUR PRENATAL VISITS During a routine prenatal visit: 2. You will be weighed to make sure you and the fetus are growing normally. 3. Your blood pressure will be taken. 4. Your abdomen will be measured to track your baby's growth. 5. The fetal heartbeat will be listened to. 6. Any test results from the previous visit will be discussed. Your health care provider may ask you: 2. How you are feeling. 3. If you are feeling the baby move. 4. If you have had any abnormal symptoms, such as leaking fluid, bleeding, severe   headaches, or abdominal cramping. 5. If you are using any tobacco products, including cigarettes, chewing tobacco, and electronic cigarettes. 6. If you have any questions. Other tests that may be performed during your second trimester include: 2. Blood tests that check for: 1. Low iron levels (anemia). 2. Gestational diabetes (between 24 and 28 weeks). 3. Rh antibodies. 3. Urine tests to check for infections, diabetes, or protein in the urine. 4. An ultrasound to confirm the proper growth and development of the baby. 5. An amniocentesis to check for  possible genetic problems. 6. Fetal screens for spina bifida and Down syndrome. 7. HIV (human immunodeficiency virus) testing. Routine prenatal testing includes screening for HIV, unless you choose not to have this test. HOME CARE INSTRUCTIONS  3. Avoid all smoking, herbs, alcohol, and unprescribed drugs. These chemicals affect the formation and growth of the baby. 4. Do not use any tobacco products, including cigarettes, chewing tobacco, and electronic cigarettes. If you need help quitting, ask your health care provider. You may receive counseling support and other resources to help you quit. 5. Follow your health care provider's instructions regarding medicine use. There are medicines that are either safe or unsafe to take during pregnancy. 6. Exercise only as directed by your health care provider. Experiencing uterine cramps is a good sign to stop exercising. 7. Continue to eat regular, healthy meals. 8. Wear a good support bra for breast tenderness. 9. Do not use hot tubs, steam rooms, or saunas. 10. Wear your seat belt at all times when driving. 11. Avoid raw meat, uncooked cheese, cat litter boxes, and soil used by cats. These carry germs that can cause birth defects in the baby. 12. Take your prenatal vitamins. 13. Take 1500-2000 mg of calcium daily starting at the 20th week of pregnancy until you deliver your baby. 14. Try taking a stool softener (if your health care provider approves) if you develop constipation. Eat more high-fiber foods, such as fresh vegetables or fruit and whole grains. Drink plenty of fluids to keep your urine clear or pale yellow. 15. Take warm sitz baths to soothe any pain or discomfort caused by hemorrhoids. Use hemorrhoid cream if your health care provider approves. 16. If you develop varicose veins, wear support hose. Elevate your feet for 15 minutes, 3-4 times a day. Limit salt in your diet. 17. Avoid heavy lifting, wear low heel shoes, and practice good  posture. 18. Rest with your legs elevated if you have leg cramps or low back pain. 19. Visit your dentist if you have not gone yet during your pregnancy. Use a soft toothbrush to brush your teeth and be gentle when you floss. 20. A sexual relationship may be continued unless your health care provider directs you otherwise. 21. Continue to go to all your prenatal visits as directed by your health care provider. SEEK MEDICAL CARE IF:   You have dizziness.  You have mild pelvic cramps, pelvic pressure, or nagging pain in the abdominal area.  You have persistent nausea, vomiting, or diarrhea.  You have a bad smelling vaginal discharge.  You have pain with urination. SEEK IMMEDIATE MEDICAL CARE IF:   You have a fever.  You are leaking fluid from your vagina.  You have spotting or bleeding from your vagina.  You have severe abdominal cramping or pain.  You have rapid weight gain or loss.  You have shortness of breath with chest pain.  You notice sudden or extreme swelling of your face, hands, ankles, feet, or legs.    You have not felt your baby move in over an hour.  You have severe headaches that do not go away with medicine.  You have vision changes.   This information is not intended to replace advice given to you by your health care provider. Make sure you discuss any questions you have with your health care provider.   Document Released: 06/29/2001 Document Revised: 07/26/2014 Document Reviewed: 09/05/2012 Elsevier Interactive Patient Education 2016 [employer] Inc.  

## 2016-01-21 NOTE — Progress Notes (Signed)
Low-risk OB appointment G2P1001 2218w1d Estimated Date of Delivery: 06/08/16 BP 100/70 mmHg  Pulse 78  Wt 151 lb (68.493 kg)  LMP 09/02/2015  BP, weight, and urine reviewed.  Refer to obstetrical flow sheet for FH & FHR.  Reports good fm.  Denies regular uc's, lof, vb, or uti s/s. No complaints. Reviewed today's normal anatomy u/s, warning s/s to report. Plan:  Continue routine obstetrical care  F/U in 4wks for OB appointment

## 2016-01-21 NOTE — Progress Notes (Signed)
US 20+1 wks,measurement c/w dates,normal ov's bilat,ant pl gr 0,cx 3.5 cm,svp of fluid 5 cm,fhr 146 bpm,efw 358 g,anatomy complete,no obvious abnormalities seen

## 2016-02-18 ENCOUNTER — Encounter: Payer: Self-pay | Admitting: Women's Health

## 2016-02-18 ENCOUNTER — Ambulatory Visit (INDEPENDENT_AMBULATORY_CARE_PROVIDER_SITE_OTHER): Payer: Medicaid Other | Admitting: Women's Health

## 2016-02-18 VITALS — BP 102/60 | HR 84 | Wt 154.0 lb

## 2016-02-18 DIAGNOSIS — Z3492 Encounter for supervision of normal pregnancy, unspecified, second trimester: Secondary | ICD-10-CM

## 2016-02-18 DIAGNOSIS — Z3A24 24 weeks gestation of pregnancy: Secondary | ICD-10-CM

## 2016-02-18 DIAGNOSIS — Z331 Pregnant state, incidental: Secondary | ICD-10-CM

## 2016-02-18 DIAGNOSIS — Z3482 Encounter for supervision of other normal pregnancy, second trimester: Secondary | ICD-10-CM

## 2016-02-18 DIAGNOSIS — Z1389 Encounter for screening for other disorder: Secondary | ICD-10-CM

## 2016-02-18 LAB — POCT URINALYSIS DIPSTICK
GLUCOSE UA: NEGATIVE
KETONES UA: NEGATIVE
Leukocytes, UA: NEGATIVE
NITRITE UA: NEGATIVE
Protein, UA: NEGATIVE
RBC UA: NEGATIVE

## 2016-02-18 NOTE — Progress Notes (Signed)
Low-risk OB appointment G2P1001 [redacted]w[redacted]d Estimated Date of Delivery: 06/08/16 BP 102/60   Pulse 84   Wt 154 lb (69.9 kg)   LMP 09/02/2015   BMI 28.63 kg/m   BP, weight, and urine reviewed.  Refer to obstetrical flow sheet for FH & FHR.  Reports good fm.  Denies regular uc's, lof, vb, or uti s/s. No complaints. Reviewed ptl s/s, fm. Plan:  Continue routine obstetrical care  F/U in 4wks for OB appointment and pn2

## 2016-02-18 NOTE — Patient Instructions (Signed)
You will have your sugar test next visit.  Please do not eat or drink anything after midnight the night before you come, not even water.  You will be here for at least two hours.     Call the office ([phone]) or go to [facility] if:  You begin to have strong, frequent contractions  Your water breaks.  Sometimes it is a big gush of fluid, sometimes it is just a trickle that keeps getting your panties wet or running down your legs  You have vaginal bleeding.  It is normal to have a small amount of spotting if your cervix was checked.   You don't feel your baby moving like normal.  If you don't, get you something to eat and drink and lay down and focus on feeling your baby move.   If your baby is still not moving like normal, you should call the office or go to [facility].  Second Trimester of Pregnancy The second trimester is from week 13 through week 28, months 4 through 6. The second trimester is often a time when you feel your best. Your body has also adjusted to being pregnant, and you begin to feel better physically. Usually, morning sickness has lessened or quit completely, you may have more energy, and you may have an increase in appetite. The second trimester is also a time when the fetus is growing rapidly. At the end of the sixth month, the fetus is about 9 inches long and weighs about 1 pounds. You will likely begin to feel the baby move (quickening) between 18 and 20 weeks of the pregnancy. BODY CHANGES Your body goes through many changes during pregnancy. The changes vary from woman to woman.   Your weight will continue to increase. You will notice your lower abdomen bulging out.  You may begin to get stretch marks on your hips, abdomen, and breasts.  You may develop headaches that can be relieved by medicines approved by your health care provider.  You may urinate more often because the fetus is pressing on your bladder.  You may develop or continue to have  heartburn as a result of your pregnancy.  You may develop constipation because certain hormones are causing the muscles that push waste through your intestines to slow down.  You may develop hemorrhoids or swollen, bulging veins (varicose veins).  You may have back pain because of the weight gain and pregnancy hormones relaxing your joints between the bones in your pelvis and as a result of a shift in weight and the muscles that support your balance.  Your breasts will continue to grow and be tender.  Your gums may bleed and may be sensitive to brushing and flossing.  Dark spots or blotches (chloasma, mask of pregnancy) may develop on your face. This will likely fade after the baby is born.  A dark line from your belly button to the pubic area (linea nigra) may appear. This will likely fade after the baby is born.  You may have changes in your hair. These can include thickening of your hair, rapid growth, and changes in texture. Some women also have hair loss during or after pregnancy, or hair that feels dry or thin. Your hair will most likely return to normal after your baby is born. WHAT TO EXPECT AT YOUR PRENATAL VISITS During a routine prenatal visit:  You will be weighed to make sure you and the fetus are growing normally.  Your blood pressure will be taken.    Your abdomen will be measured to track your baby's growth.  The fetal heartbeat will be listened to.  Any test results from the previous visit will be discussed. Your health care provider may ask you:  How you are feeling.  If you are feeling the baby move.  If you have had any abnormal symptoms, such as leaking fluid, bleeding, severe headaches, or abdominal cramping.  If you have any questions. Other tests that may be performed during your second trimester include:  Blood tests that check for:  Low iron levels (anemia).  Gestational diabetes (between 24 and 28 weeks).  Rh antibodies.  Urine tests to check  for infections, diabetes, or protein in the urine.  An ultrasound to confirm the proper growth and development of the baby.  An amniocentesis to check for possible genetic problems.  Fetal screens for spina bifida and Down syndrome. HOME CARE INSTRUCTIONS   Avoid all smoking, herbs, alcohol, and unprescribed drugs. These chemicals affect the formation and growth of the baby.  Follow your health care provider's instructions regarding medicine use. There are medicines that are either safe or unsafe to take during pregnancy.  Exercise only as directed by your health care provider. Experiencing uterine cramps is a good sign to stop exercising.  Continue to eat regular, healthy meals.  Wear a good support bra for breast tenderness.  Do not use hot tubs, steam rooms, or saunas.  Wear your seat belt at all times when driving.  Avoid raw meat, uncooked cheese, cat litter boxes, and soil used by cats. These carry germs that can cause birth defects in the baby.  Take your prenatal vitamins.  Try taking a stool softener (if your health care provider approves) if you develop constipation. Eat more high-fiber foods, such as fresh vegetables or fruit and whole grains. Drink plenty of fluids to keep your urine clear or pale yellow.  Take warm sitz baths to soothe any pain or discomfort caused by hemorrhoids. Use hemorrhoid cream if your health care provider approves.  If you develop varicose veins, wear support hose. Elevate your feet for 15 minutes, 3-4 times a day. Limit salt in your diet.  Avoid heavy lifting, wear low heel shoes, and practice good posture.  Rest with your legs elevated if you have leg cramps or low back pain.  Visit your dentist if you have not gone yet during your pregnancy. Use a soft toothbrush to brush your teeth and be gentle when you floss.  A sexual relationship may be continued unless your health care provider directs you otherwise.  Continue to go to all your  prenatal visits as directed by your health care provider. SEEK MEDICAL CARE IF:   You have dizziness.  You have mild pelvic cramps, pelvic pressure, or nagging pain in the abdominal area.  You have persistent nausea, vomiting, or diarrhea.  You have a bad smelling vaginal discharge.  You have pain with urination. SEEK IMMEDIATE MEDICAL CARE IF:   You have a fever.  You are leaking fluid from your vagina.  You have spotting or bleeding from your vagina.  You have severe abdominal cramping or pain.  You have rapid weight gain or loss.  You have shortness of breath with chest pain.  You notice sudden or extreme swelling of your face, hands, ankles, feet, or legs.  You have not felt your baby move in over an hour.  You have severe headaches that do not go away with medicine.  You have vision changes.   Document Released: 06/29/2001 Document Revised: 07/10/2013 Document Reviewed: 09/05/2012 [employer] Patient Information 2015 [employer]. This information is not intended to replace advice given to you by your health care provider. Make sure you discuss any questions you have with your health care provider.     

## 2016-03-17 ENCOUNTER — Ambulatory Visit (INDEPENDENT_AMBULATORY_CARE_PROVIDER_SITE_OTHER): Payer: Medicaid Other | Admitting: Advanced Practice Midwife

## 2016-03-17 ENCOUNTER — Encounter: Payer: Self-pay | Admitting: Advanced Practice Midwife

## 2016-03-17 ENCOUNTER — Other Ambulatory Visit: Payer: Medicaid Other

## 2016-03-17 VITALS — BP 112/70 | HR 84 | Wt 156.5 lb

## 2016-03-17 DIAGNOSIS — Z369 Encounter for antenatal screening, unspecified: Secondary | ICD-10-CM

## 2016-03-17 DIAGNOSIS — Z131 Encounter for screening for diabetes mellitus: Secondary | ICD-10-CM

## 2016-03-17 DIAGNOSIS — Z3A29 29 weeks gestation of pregnancy: Secondary | ICD-10-CM

## 2016-03-17 DIAGNOSIS — Z331 Pregnant state, incidental: Secondary | ICD-10-CM

## 2016-03-17 DIAGNOSIS — Z3483 Encounter for supervision of other normal pregnancy, third trimester: Secondary | ICD-10-CM

## 2016-03-17 DIAGNOSIS — Z3493 Encounter for supervision of normal pregnancy, unspecified, third trimester: Secondary | ICD-10-CM

## 2016-03-17 DIAGNOSIS — Z1389 Encounter for screening for other disorder: Secondary | ICD-10-CM

## 2016-03-17 LAB — POCT URINALYSIS DIPSTICK
Glucose, UA: NEGATIVE
KETONES UA: NEGATIVE
Nitrite, UA: NEGATIVE
PROTEIN UA: NEGATIVE

## 2016-03-17 NOTE — Progress Notes (Signed)
G2P1001 3212w1d Estimated Date of Delivery: 06/08/16  Blood pressure 112/70, pulse 84, weight 156 lb 8 oz (71 kg), last menstrual period 09/02/2015.   BP weight and urine results all reviewed and noted.  Please refer to the obstetrical flow sheet for the fundal height and fetal heart rate documentation:  Patient reports good fetal movement, denies any bleeding and no rupture of membranes symptoms or regular contractions. Patient is without complaints. Yeast rash under breasts All questions were answered.  Orders Placed This Encounter  Procedures  . POCT Urinalysis Dipstick    Plan:  Continued routine obstetrical care, PN2 today.  BTL papers signed.  OTC antifungal/powder under breasts  Return in about 3 weeks (around 04/07/2016) for LROB.

## 2016-03-17 NOTE — Patient Instructions (Addendum)

## 2016-03-18 LAB — CBC
HEMOGLOBIN: 11.9 g/dL (ref 11.1–15.9)
Hematocrit: 34.8 % (ref 34.0–46.6)
MCH: 31.2 pg (ref 26.6–33.0)
MCHC: 34.2 g/dL (ref 31.5–35.7)
MCV: 91 fL (ref 79–97)
Platelets: 224 10*3/uL (ref 150–379)
RBC: 3.82 x10E6/uL (ref 3.77–5.28)
RDW: 13 % (ref 12.3–15.4)
WBC: 10.1 10*3/uL (ref 3.4–10.8)

## 2016-03-18 LAB — GLUCOSE TOLERANCE, 2 HOURS W/ 1HR
GLUCOSE, 1 HOUR: 132 mg/dL (ref 65–179)
GLUCOSE, 2 HOUR: 149 mg/dL (ref 65–152)
Glucose, Fasting: 72 mg/dL (ref 65–91)

## 2016-03-18 LAB — HIV ANTIBODY (ROUTINE TESTING W REFLEX): HIV Screen 4th Generation wRfx: NONREACTIVE

## 2016-03-18 LAB — ANTIBODY SCREEN: Antibody Screen: NEGATIVE

## 2016-03-18 LAB — RPR: RPR: NONREACTIVE

## 2016-03-31 ENCOUNTER — Encounter: Payer: Self-pay | Admitting: *Deleted

## 2016-03-31 ENCOUNTER — Encounter: Payer: Medicaid Other | Admitting: Obstetrics and Gynecology

## 2016-04-07 ENCOUNTER — Ambulatory Visit (INDEPENDENT_AMBULATORY_CARE_PROVIDER_SITE_OTHER): Payer: Medicaid Other | Admitting: Advanced Practice Midwife

## 2016-04-07 ENCOUNTER — Encounter: Payer: Self-pay | Admitting: Advanced Practice Midwife

## 2016-04-07 VITALS — BP 118/80 | HR 84 | Wt 159.0 lb

## 2016-04-07 DIAGNOSIS — O360130 Maternal care for anti-D [Rh] antibodies, third trimester, not applicable or unspecified: Secondary | ICD-10-CM | POA: Diagnosis not present

## 2016-04-07 DIAGNOSIS — Z1389 Encounter for screening for other disorder: Secondary | ICD-10-CM

## 2016-04-07 DIAGNOSIS — Z3493 Encounter for supervision of normal pregnancy, unspecified, third trimester: Secondary | ICD-10-CM

## 2016-04-07 DIAGNOSIS — Z3483 Encounter for supervision of other normal pregnancy, third trimester: Secondary | ICD-10-CM

## 2016-04-07 DIAGNOSIS — Z3A32 32 weeks gestation of pregnancy: Secondary | ICD-10-CM

## 2016-04-07 DIAGNOSIS — Z331 Pregnant state, incidental: Secondary | ICD-10-CM

## 2016-04-07 LAB — POCT URINALYSIS DIPSTICK
Blood, UA: NEGATIVE
Glucose, UA: NEGATIVE
Ketones, UA: NEGATIVE
Leukocytes, UA: NEGATIVE
Nitrite, UA: NEGATIVE
PROTEIN UA: NEGATIVE

## 2016-04-07 MED ORDER — RHO D IMMUNE GLOBULIN 1500 UNIT/2ML IJ SOSY
300.0000 ug | PREFILLED_SYRINGE | Freq: Once | INTRAMUSCULAR | Status: AC
  Administered 2016-04-07: 300 ug via INTRAMUSCULAR

## 2016-04-07 NOTE — Progress Notes (Signed)
G2P1001 5080w1d Estimated Date of Delivery: 06/08/16  Blood pressure 118/80, pulse 84, weight 159 lb (72.1 kg), last menstrual period 09/02/2015.   BP weight and urine results all reviewed and noted.  Please refer to the obstetrical flow sheet for the fundal height and fetal heart rate documentation:  Patient reports good fetal movement, denies any bleeding and no rupture of membranes symptoms or regular contractions. Patient is without complaints. All questions were answered.  Orders Placed This Encounter  Procedures  . POCT urinalysis dipstick    Plan:  Continued routine obstetrical care,   Return in about 2 weeks (around 04/21/2016) for LROB.

## 2016-04-21 ENCOUNTER — Ambulatory Visit (INDEPENDENT_AMBULATORY_CARE_PROVIDER_SITE_OTHER): Payer: Medicaid Other | Admitting: Women's Health

## 2016-04-21 ENCOUNTER — Encounter: Payer: Self-pay | Admitting: Women's Health

## 2016-04-21 VITALS — BP 110/78 | HR 76 | Wt 163.0 lb

## 2016-04-21 DIAGNOSIS — Z3483 Encounter for supervision of other normal pregnancy, third trimester: Secondary | ICD-10-CM

## 2016-04-21 DIAGNOSIS — O26899 Other specified pregnancy related conditions, unspecified trimester: Secondary | ICD-10-CM

## 2016-04-21 DIAGNOSIS — Z331 Pregnant state, incidental: Secondary | ICD-10-CM

## 2016-04-21 DIAGNOSIS — Z1389 Encounter for screening for other disorder: Secondary | ICD-10-CM

## 2016-04-21 DIAGNOSIS — Z6791 Unspecified blood type, Rh negative: Secondary | ICD-10-CM

## 2016-04-21 LAB — POCT URINALYSIS DIPSTICK
Glucose, UA: NEGATIVE
KETONES UA: NEGATIVE
Leukocytes, UA: NEGATIVE
Nitrite, UA: NEGATIVE
PROTEIN UA: NEGATIVE
RBC UA: NEGATIVE

## 2016-04-21 NOTE — Patient Instructions (Signed)
Call the office ([phone]) or go to [facility] if:  You begin to have strong, frequent contractions  Your water breaks.  Sometimes it is a big gush of fluid, sometimes it is just a trickle that keeps getting your panties wet or running down your legs  You have vaginal bleeding.  It is normal to have a small amount of spotting if your cervix was checked.   You don't feel your baby moving like normal.  If you don't, get you something to eat and drink and lay down and focus on feeling your baby move.  You should feel at least 10 movements in 2 hours.  If you don't, you should call the office or go to [facility].   Tdap Vaccine  It is recommended that you get the Tdap vaccine during the third trimester of EACH pregnancy to help protect your baby from getting pertussis (whooping cough)  27-36 weeks is the BEST time to do this so that you can pass the protection on to your baby. During pregnancy is better than after pregnancy, but if you are unable to get it during pregnancy it will be offered at the hospital.   You can get this vaccine at the health department or your family doctor  Everyone who will be around your baby should also be up-to-date on their vaccines. Adults (who are not pregnant) only need 1 dose of Tdap during adulthood.       Preterm Labor Information Preterm labor is when labor starts at less than 37 weeks of pregnancy. The normal length of a pregnancy is 39 to 41 weeks. CAUSES Often, there is no identifiable underlying cause as to why a woman goes into preterm labor. One of the most common known causes of preterm labor is infection. Infections of the uterus, cervix, vagina, amniotic sac, bladder, kidney, or even the lungs (pneumonia) can cause labor to start. Other suspected causes of preterm labor include:   Urogenital infections, such as yeast infections and bacterial vaginosis.   Uterine abnormalities (uterine shape, uterine septum, fibroids, or bleeding from  the placenta).   A cervix that has been operated on (it may fail to stay closed).   Malformations in the fetus.   Multiple gestations (twins, triplets, and so on).   Breakage of the amniotic sac.  RISK FACTORS  Having a previous history of preterm labor.   Having premature rupture of membranes (PROM).   Having a placenta that covers the opening of the cervix (placenta previa).   Having a placenta that separates from the uterus (placental abruption).   Having a cervix that is too weak to hold the fetus in the uterus (incompetent cervix).   Having too much fluid in the amniotic sac (polyhydramnios).   Taking illegal drugs or smoking while pregnant.   Not gaining enough weight while pregnant.   Being younger than 18 and older than 33 years old.   Having a low socioeconomic status.   Being African American. SYMPTOMS Signs and symptoms of preterm labor include:   Menstrual-like cramps, abdominal pain, or back pain.  Uterine contractions that are regular, as frequent as six in an hour, regardless of their intensity (may be mild or painful).  Contractions that start on the top of the uterus and spread down to the lower abdomen and back.   A sense of increased pelvic pressure.   A watery or bloody mucus discharge that comes from the vagina.  TREATMENT Depending on the length of the pregnancy and   other circumstances, your health care provider may suggest bed rest. If necessary, there are medicines that can be given to stop contractions and to mature the fetal lungs. If labor happens before 34 weeks of pregnancy, a prolonged hospital stay may be recommended. Treatment depends on the condition of both you and the fetus.  WHAT SHOULD YOU DO IF YOU THINK YOU ARE IN PRETERM LABOR? Call your health care provider right away. You will need to go to the hospital to get checked immediately. HOW CAN YOU PREVENT PRETERM LABOR IN FUTURE PREGNANCIES? You should:   Stop  smoking if you smoke.  Maintain healthy weight gain and avoid chemicals and drugs that are not necessary.  Be watchful for any type of infection.  Inform your health care provider if you have a known history of preterm labor.   This information is not intended to replace advice given to you by your health care provider. Make sure you discuss any questions you have with your health care provider.   Document Released: 09/25/2003 Document Revised: 03/07/2013 Document Reviewed: 08/07/2012 Elsevier Interactive Patient Education 2016 Elsevier Inc.  

## 2016-04-21 NOTE — Progress Notes (Signed)
Low-risk OB appointment G2P1001 2866w1d Estimated Date of Delivery: 06/08/16 BP 110/78   Pulse 76   Wt 163 lb (73.9 kg)   LMP 09/02/2015   BMI 30.30 kg/m   BP, weight, and urine reviewed.  Refer to obstetrical flow sheet for FH & FHR.  Reports good fm.  Denies regular uc's, lof, vb, or uti s/s. No complaints. Reviewed ptl s/s, fkc. Recommended Tdap at HD/PCP per CDC guidelines.  Plan:  Continue routine obstetrical care  F/U in 2wks for OB appointment  Declines flu shot

## 2016-05-05 ENCOUNTER — Ambulatory Visit (INDEPENDENT_AMBULATORY_CARE_PROVIDER_SITE_OTHER): Payer: Medicaid Other | Admitting: Women's Health

## 2016-05-05 ENCOUNTER — Encounter: Payer: Self-pay | Admitting: Women's Health

## 2016-05-05 VITALS — BP 126/72 | HR 80 | Wt 164.0 lb

## 2016-05-05 DIAGNOSIS — Z3483 Encounter for supervision of other normal pregnancy, third trimester: Secondary | ICD-10-CM

## 2016-05-05 DIAGNOSIS — Z3A35 35 weeks gestation of pregnancy: Secondary | ICD-10-CM

## 2016-05-05 DIAGNOSIS — Z331 Pregnant state, incidental: Secondary | ICD-10-CM

## 2016-05-05 DIAGNOSIS — Z1389 Encounter for screening for other disorder: Secondary | ICD-10-CM

## 2016-05-05 LAB — POCT URINALYSIS DIPSTICK
Blood, UA: NEGATIVE
Glucose, UA: NEGATIVE
Ketones, UA: NEGATIVE
LEUKOCYTES UA: NEGATIVE
Nitrite, UA: NEGATIVE
PROTEIN UA: NEGATIVE

## 2016-05-05 NOTE — Progress Notes (Signed)
Low-risk OB appointment G2P1001 3114w1d Estimated Date of Delivery: 06/08/16 BP 126/72   Pulse 80   Wt 164 lb (74.4 kg)   LMP 09/02/2015   BMI 30.49 kg/m   BP, weight, and urine reviewed.  Refer to obstetrical flow sheet for FH & FHR.  Reports good fm.  Denies regular uc's, lof, vb, or uti s/s. No complaints. Reviewed ptl s/s, fkc. Plan:  Continue routine obstetrical care  F/U in 1wk for OB appointment

## 2016-05-05 NOTE — Patient Instructions (Signed)
[place] Pediatricians/Family Doctors:  [facility] [phone]            [facility] [phone]                 [facility] [phone] (usually not accepting new patients unless you have family there already, you are always welcome to call and ask)            [facility] ([address] [place]) [phone]   [place] Pediatricians/Family Doctors:   [facility]: [phone]  [facility]: [phone]   Call the office ([phone]) or go to [facility] if:  You begin to have strong, frequent contractions  Your water breaks.  Sometimes it is a big gush of fluid, sometimes it is just a trickle that keeps getting your panties wet or running down your legs  You have vaginal bleeding.  It is normal to have a small amount of spotting if your cervix was checked.   You don't feel your baby moving like normal.  If you don't, get you something to eat and drink and lay down and focus on feeling your baby move.  You should feel at least 10 movements in 2 hours.  If you don't, you should call the office or go to [facility].   Preterm Labor Information Preterm labor is when labor starts at less than 37 weeks of pregnancy. The normal length of a pregnancy is 39 to 41 weeks. CAUSES Often, there is no identifiable underlying cause as to why a woman goes into preterm labor. One of the most common known causes of preterm labor is infection. Infections of the uterus, cervix, vagina, amniotic sac, bladder, kidney, or even the lungs (pneumonia) can cause labor to start. Other suspected causes of preterm labor include:   Urogenital infections, such as yeast infections and bacterial vaginosis.   Uterine abnormalities (uterine shape, uterine septum, fibroids, or bleeding from the placenta).   A cervix that has been operated on (it may fail to stay closed).   Malformations in the fetus.   Multiple  gestations (twins, triplets, and so on).   Breakage of the amniotic sac.  RISK FACTORS  Having a previous history of preterm labor.   Having premature rupture of membranes (PROM).   Having a placenta that covers the opening of the cervix (placenta previa).   Having a placenta that separates from the uterus (placental abruption).   Having a cervix that is too weak to hold the fetus in the uterus (incompetent cervix).   Having too much fluid in the amniotic sac (polyhydramnios).   Taking illegal drugs or smoking while pregnant.   Not gaining enough weight while pregnant.   Being younger than 18 and older than 33 years old.   Having a low socioeconomic status.   Being African American. SYMPTOMS Signs and symptoms of preterm labor include:   Menstrual-like cramps, abdominal pain, or back pain.  Uterine contractions that are regular, as frequent as six in an hour, regardless of their intensity (may be mild or painful).  Contractions that start on the top of the uterus and spread down to the lower abdomen and back.   A sense of increased pelvic pressure.   A watery or bloody mucus discharge that comes from the vagina.  TREATMENT Depending on the length of the pregnancy and other circumstances, your health care provider may suggest bed rest. If necessary, there are medicines that can be given to stop contractions and to   mature the fetal lungs. If labor happens before 34 weeks of pregnancy, a prolonged hospital stay may be recommended. Treatment depends on the condition of both you and the fetus.  WHAT SHOULD YOU DO IF YOU THINK YOU ARE IN PRETERM LABOR? Call your health care provider right away. You will need to go to the hospital to get checked immediately. HOW CAN YOU PREVENT PRETERM LABOR IN FUTURE PREGNANCIES? You should:   Stop smoking if you smoke.  Maintain healthy weight gain and avoid chemicals and drugs that are not necessary.  Be watchful for any  type of infection.  Inform your health care provider if you have a known history of preterm labor.   This information is not intended to replace advice given to you by your health care provider. Make sure you discuss any questions you have with your health care provider.   Document Released: 09/25/2003 Document Revised: 03/07/2013 Document Reviewed: 08/07/2012 Elsevier Interactive Patient Education 2016 Elsevier Inc.  

## 2016-05-13 ENCOUNTER — Ambulatory Visit (INDEPENDENT_AMBULATORY_CARE_PROVIDER_SITE_OTHER): Payer: Medicaid Other | Admitting: Advanced Practice Midwife

## 2016-05-13 ENCOUNTER — Encounter: Payer: Self-pay | Admitting: Advanced Practice Midwife

## 2016-05-13 VITALS — BP 122/68 | HR 68 | Wt 166.0 lb

## 2016-05-13 DIAGNOSIS — Z3A37 37 weeks gestation of pregnancy: Secondary | ICD-10-CM

## 2016-05-13 DIAGNOSIS — Z3483 Encounter for supervision of other normal pregnancy, third trimester: Secondary | ICD-10-CM

## 2016-05-13 DIAGNOSIS — Z1389 Encounter for screening for other disorder: Secondary | ICD-10-CM

## 2016-05-13 DIAGNOSIS — Z331 Pregnant state, incidental: Secondary | ICD-10-CM

## 2016-05-13 LAB — POCT URINALYSIS DIPSTICK
Blood, UA: NEGATIVE
Glucose, UA: NEGATIVE
KETONES UA: NEGATIVE
LEUKOCYTES UA: NEGATIVE
NITRITE UA: NEGATIVE
PROTEIN UA: NEGATIVE

## 2016-05-13 NOTE — Progress Notes (Signed)
[id number] 1768w2d Estimated Date of Delivery: 06/08/16  Last menstrual period 09/02/2015.   BP weight and urine results all reviewed and noted.  Please refer to the obstetrical flow sheet for the fundal height and fetal heart rate documentation:  Patient reports good fetal movement, denies any bleeding and no rupture of membranes symptoms or regular contractions. Patient is without complaints. All questions were answered.  Orders Placed This Encounter  Procedures  . POCT urinalysis dipstick    Plan:  Continued routine obstetrical care,   Return in about 1 week (around 05/20/2016) for LROB.

## 2016-05-13 NOTE — Patient Instructions (Signed)

## 2016-05-20 ENCOUNTER — Ambulatory Visit (INDEPENDENT_AMBULATORY_CARE_PROVIDER_SITE_OTHER): Payer: Medicaid Other | Admitting: Obstetrics and Gynecology

## 2016-05-20 VITALS — BP 128/80 | HR 76 | Wt 166.0 lb

## 2016-05-20 DIAGNOSIS — Z1389 Encounter for screening for other disorder: Secondary | ICD-10-CM

## 2016-05-20 DIAGNOSIS — Z369 Encounter for antenatal screening, unspecified: Secondary | ICD-10-CM

## 2016-05-20 DIAGNOSIS — Z3A38 38 weeks gestation of pregnancy: Secondary | ICD-10-CM

## 2016-05-20 DIAGNOSIS — Z3483 Encounter for supervision of other normal pregnancy, third trimester: Secondary | ICD-10-CM

## 2016-05-20 DIAGNOSIS — Z3685 Encounter for antenatal screening for Streptococcus B: Secondary | ICD-10-CM

## 2016-05-20 DIAGNOSIS — Z331 Pregnant state, incidental: Secondary | ICD-10-CM

## 2016-05-20 LAB — POCT URINALYSIS DIPSTICK
Blood, UA: NEGATIVE
Glucose, UA: NEGATIVE
LEUKOCYTES UA: NEGATIVE
Nitrite, UA: NEGATIVE

## 2016-05-20 LAB — OB RESULTS CONSOLE GBS: GBS: NEGATIVE

## 2016-05-20 NOTE — Progress Notes (Signed)
Patient ID: Susan PolioJennifer Salinas, female   DOB: 1980-09-18, 35 y.o.   MRN: [id number]  G2P1001 5853w2d Estimated Date of Delivery: 06/08/16 LROB  Patient reports good fetal movement, denies any bleeding and no rupture of membranes symptoms or regular contractions. Patient complaints: pelvic pain and pressure x yesterday that has since resolved. Pt has not tried any medications for the relief of her symptoms. Pt denies any other symptoms. Pt notes that this is her second pregnancy.   Blood pressure 128/80, pulse 76, weight 166 lb (75.3 kg), last menstrual period 09/02/2015. refer to the ob flow sheet for FH and FHR, also BP, Wt, Urine results:notable for trace of ketones, trace of protein, otherwise negative                          Physical Examination:  General appearance - alert, well appearing, and in no distress Abdomen - FH 34 cm                   -FHR 156 soft, nontender, nondistended, no masses or organomegaly Pelvic - normal external genitalia, vulva, vagina, cervix, uterus and adnexa VULVA: normal appearing vulva with no masses, tenderness or lesions,  VAGINA: normal appearing vagina with normal color and discharge, no lesions,  CERVIX: normal appearing cervix without discharge or lesions, mid-position, closed, 1 cm, long, firm,  UTERUS: uterus is normal size, shape, consistency and nontender,                                             Questions were answered. Assessment: LROB G2P1001 @ 5453w2d  Plan:  Continued routine obstetrical care, group B strep, GC/Chlamydia probe  F/u in 1 week for routine OB  By signing my name below, I, Soijett Blue, attest that this documentation has been prepared under the direction and in the presence of Tilda BurrowJohn V Aarya Robinson, MD. Electronically Signed: Soijett Blue, ED Scribe. 05/20/16. 9:01 AM.  I personally performed the services described in this documentation, which was SCRIBED in my presence. The recorded information has been reviewed and considered  accurate. It has been edited as necessary during review. Tilda BurrowFERGUSON,Eland Lamantia V, MD

## 2016-05-22 LAB — GC/CHLAMYDIA PROBE AMP
Chlamydia trachomatis, NAA: NEGATIVE
Neisseria gonorrhoeae by PCR: NEGATIVE

## 2016-05-22 LAB — STREP GP B NAA: Strep Gp B NAA: NEGATIVE

## 2016-05-27 ENCOUNTER — Encounter: Payer: Self-pay | Admitting: Women's Health

## 2016-05-27 ENCOUNTER — Ambulatory Visit (INDEPENDENT_AMBULATORY_CARE_PROVIDER_SITE_OTHER): Payer: Medicaid Other | Admitting: Women's Health

## 2016-05-27 VITALS — BP 120/70 | HR 72 | Wt 167.0 lb

## 2016-05-27 DIAGNOSIS — Z3A39 39 weeks gestation of pregnancy: Secondary | ICD-10-CM | POA: Diagnosis not present

## 2016-05-27 DIAGNOSIS — Z3483 Encounter for supervision of other normal pregnancy, third trimester: Secondary | ICD-10-CM | POA: Diagnosis not present

## 2016-05-27 DIAGNOSIS — Z331 Pregnant state, incidental: Secondary | ICD-10-CM

## 2016-05-27 DIAGNOSIS — Z1389 Encounter for screening for other disorder: Secondary | ICD-10-CM

## 2016-05-27 LAB — POCT URINALYSIS DIPSTICK
Glucose, UA: NEGATIVE
KETONES UA: NEGATIVE
LEUKOCYTES UA: NEGATIVE
Nitrite, UA: NEGATIVE
RBC UA: NEGATIVE

## 2016-05-27 NOTE — Progress Notes (Signed)
Low-risk OB appointment G2P1001 5973w2d Estimated Date of Delivery: 06/08/16 BP 120/70   Pulse 72   Wt 167 lb (75.8 kg)   LMP 09/02/2015   BMI 31.04 kg/m   BP, weight, and urine reviewed.  Refer to obstetrical flow sheet for FH & FHR.  Reports good fm.  Denies regular uc's, lof, vb, or uti s/s. No complaints. SVE per request: 1/th/-2, vtx Reviewed gbs neg, ptl s/s, fkc. Plan:  Continue routine obstetrical care  F/U in 1wk for OB appointment

## 2016-05-27 NOTE — Patient Instructions (Signed)
Call the office ([phone]) or go to [facility] if:  You begin to have strong, frequent contractions  Your water breaks.  Sometimes it is a big gush of fluid, sometimes it is just a trickle that keeps getting your panties wet or running down your legs  You have vaginal bleeding.  It is normal to have a small amount of spotting if your cervix was checked.   You don't feel your baby moving like normal.  If you don't, get you something to eat and drink and lay down and focus on feeling your baby move.  You should feel at least 10 movements in 2 hours.  If you don't, you should call the office or go to [facility].    Braxton Hicks Contractions Contractions of the uterus can occur throughout pregnancy. Contractions are not always a sign that you are in labor.  WHAT ARE BRAXTON HICKS CONTRACTIONS?  Contractions that occur before labor are called Braxton Hicks contractions, or false labor. Toward the end of pregnancy (32-34 weeks), these contractions can develop more often and may become more forceful. This is not true labor because these contractions do not result in opening (dilatation) and thinning of the cervix. They are sometimes difficult to tell apart from true labor because these contractions can be forceful and people have different pain tolerances. You should not feel embarrassed if you go to the hospital with false labor. Sometimes, the only way to tell if you are in true labor is for your health care provider to look for changes in the cervix. If there are no prenatal problems or other health problems associated with the pregnancy, it is completely safe to be sent home with false labor and await the onset of true labor. HOW CAN YOU TELL THE DIFFERENCE BETWEEN TRUE AND FALSE LABOR? False Labor  The contractions of false labor are usually shorter and not as hard as those of true labor.   The contractions are usually irregular.   The contractions are often felt in the front of  the lower abdomen and in the groin.   The contractions may go away when you walk around or change positions while lying down.   The contractions get weaker and are shorter lasting as time goes on.   The contractions do not usually become progressively stronger, regular, and closer together as with true labor.  True Labor  Contractions in true labor last 30-70 seconds, become very regular, usually become more intense, and increase in frequency.   The contractions do not go away with walking.   The discomfort is usually felt in the top of the uterus and spreads to the lower abdomen and low back.   True labor can be determined by your health care provider with an exam. This will show that the cervix is dilating and getting thinner.  WHAT TO REMEMBER  Keep up with your usual exercises and follow other instructions given by your health care provider.   Take medicines as directed by your health care provider.   Keep your regular prenatal appointments.   Eat and drink lightly if you think you are going into labor.   If Braxton Hicks contractions are making you uncomfortable:   Change your position from lying down or resting to walking, or from walking to resting.   Sit and rest in a tub of warm water.   Drink 2-3 glasses of water. Dehydration may cause these contractions.   Do slow and deep breathing several times an hour.    WHEN SHOULD I SEEK IMMEDIATE MEDICAL CARE? Seek immediate medical care if:  Your contractions become stronger, more regular, and closer together.   You have fluid leaking or gushing from your vagina.   You have a fever.   You pass blood-tinged mucus.   You have vaginal bleeding.   You have continuous abdominal pain.   You have low back pain that you never had before.   You feel your baby's head pushing down and causing pelvic pressure.   Your baby is not moving as much as it used to.    This information is not intended to  replace advice given to you by your health care provider. Make sure you discuss any questions you have with your health care provider.   Document Released: 07/05/2005 Document Revised: 07/10/2013 Document Reviewed: 04/16/2013 Elsevier Interactive Patient Education 2016 Elsevier Inc.  

## 2016-05-30 ENCOUNTER — Inpatient Hospital Stay (HOSPITAL_COMMUNITY): Payer: Medicaid Other | Admitting: Anesthesiology

## 2016-05-30 ENCOUNTER — Encounter (HOSPITAL_COMMUNITY): Payer: Self-pay | Admitting: *Deleted

## 2016-05-30 ENCOUNTER — Inpatient Hospital Stay (HOSPITAL_COMMUNITY)
Admission: AD | Admit: 2016-05-30 | Discharge: 2016-05-31 | DRG: 775 | Disposition: A | Discharge status: Home / Self Care | Payer: Medicaid Other | Source: Ambulatory Visit | Attending: Obstetrics & Gynecology | Admitting: Obstetrics & Gynecology

## 2016-05-30 DIAGNOSIS — Z3403 Encounter for supervision of normal first pregnancy, third trimester: Secondary | ICD-10-CM | POA: Diagnosis present

## 2016-05-30 DIAGNOSIS — Z6791 Unspecified blood type, Rh negative: Secondary | ICD-10-CM

## 2016-05-30 DIAGNOSIS — Z3A38 38 weeks gestation of pregnancy: Secondary | ICD-10-CM

## 2016-05-30 DIAGNOSIS — O26893 Other specified pregnancy related conditions, third trimester: Principal | ICD-10-CM | POA: Diagnosis present

## 2016-05-30 LAB — RPR: RPR Ser Ql: NONREACTIVE

## 2016-05-30 LAB — CBC
HEMATOCRIT: 34.7 % — AB (ref 36.0–46.0)
Hemoglobin: 12.3 g/dL (ref 12.0–15.0)
MCH: 30.1 pg (ref 26.0–34.0)
MCHC: 35.4 g/dL (ref 30.0–36.0)
MCV: 85 fL (ref 78.0–100.0)
Platelets: 187 10*3/uL (ref 150–400)
RBC: 4.08 MIL/uL (ref 3.87–5.11)
RDW: 13.2 % (ref 11.5–15.5)
WBC: 17.1 10*3/uL — ABNORMAL HIGH (ref 4.0–10.5)

## 2016-05-30 LAB — POCT FERN TEST: POCT FERN TEST: POSITIVE

## 2016-05-30 MED ORDER — LACTATED RINGERS IV SOLN
500.0000 mL | INTRAVENOUS | Status: DC | PRN
  Administered 2016-05-30 (×2): 500 mL via INTRAVENOUS

## 2016-05-30 MED ORDER — DIBUCAINE 1 % RE OINT: 1.0000 "application " | TOPICAL_OINTMENT | RECTAL | Status: DC | PRN

## 2016-05-30 MED ORDER — METHYLERGONOVINE MALEATE 0.2 MG/ML IJ SOLN
0.2000 mg | Freq: Once | INTRAMUSCULAR | Status: AC
Start: 2016-05-30 — End: 2016-05-30
  Administered 2016-05-30: 0.2 mg via INTRAMUSCULAR

## 2016-05-30 MED ORDER — MEASLES, MUMPS & RUBELLA VAC ~~LOC~~ INJ
0.5000 mL | INJECTION | Freq: Once | SUBCUTANEOUS | Status: DC
  Filled 2016-05-30: qty 0.5

## 2016-05-30 MED ORDER — COCONUT OIL OIL: 1.0000 "application " | TOPICAL_OIL | Status: DC | PRN

## 2016-05-30 MED ORDER — TERBUTALINE SULFATE 1 MG/ML IJ SOLN
0.2500 mg | Freq: Once | INTRAMUSCULAR | Status: DC | PRN
  Filled 2016-05-30: qty 1

## 2016-05-30 MED ORDER — SODIUM CHLORIDE 0.9 % IV SOLN: 250.0000 mL | INTRAVENOUS | Status: DC | PRN

## 2016-05-30 MED ORDER — SODIUM CHLORIDE 0.9% FLUSH: 3.0000 mL | INTRAVENOUS | Status: DC | PRN

## 2016-05-30 MED ORDER — LACTATED RINGERS IV SOLN
INTRAVENOUS | Status: DC
  Administered 2016-05-30 (×2): 125 mL/h via INTRAVENOUS

## 2016-05-30 MED ORDER — OXYCODONE-ACETAMINOPHEN 5-325 MG PO TABS: 1.0000 | ORAL_TABLET | ORAL | Status: DC | PRN

## 2016-05-30 MED ORDER — PHENYLEPHRINE 40 MCG/ML (10ML) SYRINGE FOR IV PUSH (FOR BLOOD PRESSURE SUPPORT)
80.0000 ug | PREFILLED_SYRINGE | INTRAVENOUS | Status: DC | PRN
  Filled 2016-05-30: qty 5
  Filled 2016-05-30: qty 10

## 2016-05-30 MED ORDER — CLONAZEPAM 0.5 MG PO TABS: 2.0000 mg | ORAL_TABLET | Freq: Three times a day (TID) | ORAL | Status: DC | PRN

## 2016-05-30 MED ORDER — LIDOCAINE HCL (PF) 1 % IJ SOLN
INTRAMUSCULAR | Status: DC | PRN
  Administered 2016-05-30: 6 mL via EPIDURAL
  Administered 2016-05-30: 4 mL

## 2016-05-30 MED ORDER — WITCH HAZEL-GLYCERIN EX PADS: 1.0000 "application " | MEDICATED_PAD | CUTANEOUS | Status: DC | PRN

## 2016-05-30 MED ORDER — SODIUM CHLORIDE 0.9% FLUSH: 3.0000 mL | Freq: Two times a day (BID) | INTRAVENOUS | Status: DC

## 2016-05-30 MED ORDER — ONDANSETRON HCL 4 MG/2ML IJ SOLN
4.0000 mg | Freq: Four times a day (QID) | INTRAMUSCULAR | Status: DC | PRN
  Administered 2016-05-30: 4 mg via INTRAVENOUS
  Filled 2016-05-30: qty 2

## 2016-05-30 MED ORDER — LIDOCAINE HCL (PF) 1 % IJ SOLN
30.0000 mL | INTRAMUSCULAR | Status: DC | PRN
  Filled 2016-05-30: qty 30

## 2016-05-30 MED ORDER — ONDANSETRON HCL 4 MG/2ML IJ SOLN: 4.0000 mg | INTRAMUSCULAR | Status: DC | PRN

## 2016-05-30 MED ORDER — TETANUS-DIPHTH-ACELL PERTUSSIS 5-2.5-18.5 LF-MCG/0.5 IM SUSP: 0.5000 mL | Freq: Once | INTRAMUSCULAR | Status: DC

## 2016-05-30 MED ORDER — ONDANSETRON HCL 4 MG PO TABS: 4.0000 mg | ORAL_TABLET | ORAL | Status: DC | PRN

## 2016-05-30 MED ORDER — OXYCODONE-ACETAMINOPHEN 5-325 MG PO TABS: 2.0000 | ORAL_TABLET | ORAL | Status: DC | PRN

## 2016-05-30 MED ORDER — SIMETHICONE 80 MG PO CHEW: 80.0000 mg | CHEWABLE_TABLET | ORAL | Status: DC | PRN

## 2016-05-30 MED ORDER — OXYTOCIN BOLUS FROM INFUSION
500.0000 mL | Freq: Once | INTRAVENOUS | Status: AC
  Administered 2016-05-30: 500 mL via INTRAVENOUS

## 2016-05-30 MED ORDER — ACETAMINOPHEN 325 MG PO TABS: 650.0000 mg | ORAL_TABLET | ORAL | Status: DC | PRN

## 2016-05-30 MED ORDER — FLEET ENEMA 7-19 GM/118ML RE ENEM: 1.0000 | ENEMA | RECTAL | Status: DC | PRN

## 2016-05-30 MED ORDER — METHYLERGONOVINE MALEATE 0.2 MG/ML IJ SOLN
INTRAMUSCULAR | Status: AC
  Filled 2016-05-30: qty 1

## 2016-05-30 MED ORDER — EPHEDRINE 5 MG/ML INJ
10.0000 mg | INTRAVENOUS | Status: DC | PRN
  Filled 2016-05-30: qty 4

## 2016-05-30 MED ORDER — SOD CITRATE-CITRIC ACID 500-334 MG/5ML PO SOLN: 30.0000 mL | ORAL | Status: DC | PRN

## 2016-05-30 MED ORDER — DIPHENHYDRAMINE HCL 25 MG PO CAPS: 25.0000 mg | ORAL_CAPSULE | Freq: Four times a day (QID) | ORAL | Status: DC | PRN

## 2016-05-30 MED ORDER — LACTATED RINGERS IV SOLN: 500.0000 mL | Freq: Once | INTRAVENOUS | Status: DC

## 2016-05-30 MED ORDER — FENTANYL 2.5 MCG/ML BUPIVACAINE 1/10 % EPIDURAL INFUSION (WH - ANES)
14.0000 mL/h | INTRAMUSCULAR | Status: DC | PRN
  Administered 2016-05-30: 14 mL/h via EPIDURAL
  Filled 2016-05-30: qty 100

## 2016-05-30 MED ORDER — PHENYLEPHRINE 40 MCG/ML (10ML) SYRINGE FOR IV PUSH (FOR BLOOD PRESSURE SUPPORT)
80.0000 ug | PREFILLED_SYRINGE | INTRAVENOUS | Status: DC | PRN
  Administered 2016-05-30: 80 ug via INTRAVENOUS
  Filled 2016-05-30: qty 5

## 2016-05-30 MED ORDER — ACETAMINOPHEN 325 MG PO TABS
650.0000 mg | ORAL_TABLET | ORAL | Status: DC | PRN
  Administered 2016-05-31: 650 mg via ORAL
  Filled 2016-05-30: qty 2

## 2016-05-30 MED ORDER — IBUPROFEN 600 MG PO TABS
600.0000 mg | ORAL_TABLET | Freq: Four times a day (QID) | ORAL | Status: DC
  Administered 2016-05-30 – 2016-05-31 (×4): 600 mg via ORAL
  Filled 2016-05-30 (×4): qty 1

## 2016-05-30 MED ORDER — SENNOSIDES-DOCUSATE SODIUM 8.6-50 MG PO TABS
2.0000 | ORAL_TABLET | ORAL | Status: DC
  Administered 2016-05-30: 2 via ORAL
  Filled 2016-05-30: qty 2

## 2016-05-30 MED ORDER — PRENATAL MULTIVITAMIN CH
1.0000 | ORAL_TABLET | Freq: Every day | ORAL | Status: DC
  Administered 2016-05-31: 1 via ORAL
  Filled 2016-05-30: qty 1

## 2016-05-30 MED ORDER — OXYTOCIN 40 UNITS IN LACTATED RINGERS INFUSION - SIMPLE MED
1.0000 m[IU]/min | INTRAVENOUS | Status: DC
  Administered 2016-05-30: 2 m[IU]/min via INTRAVENOUS
  Filled 2016-05-30: qty 1000

## 2016-05-30 MED ORDER — DIPHENHYDRAMINE HCL 50 MG/ML IJ SOLN: 12.5000 mg | INTRAMUSCULAR | Status: DC | PRN

## 2016-05-30 MED ORDER — ZOLPIDEM TARTRATE 5 MG PO TABS: 5.0000 mg | ORAL_TABLET | Freq: Every evening | ORAL | Status: DC | PRN

## 2016-05-30 MED ORDER — OXYTOCIN 40 UNITS IN LACTATED RINGERS INFUSION - SIMPLE MED: 2.5000 [IU]/h | INTRAVENOUS | Status: DC

## 2016-05-30 MED ORDER — VITAMIN K1 1 MG/0.5ML IJ SOLN
INTRAMUSCULAR | Status: AC
  Filled 2016-05-30: qty 0.5

## 2016-05-30 MED ORDER — BENZOCAINE-MENTHOL 20-0.5 % EX AERO: 1.0000 "application " | INHALATION_SPRAY | CUTANEOUS | Status: DC | PRN

## 2016-05-30 NOTE — Anesthesia Pain Management Evaluation Note (Signed)
  CRNA Pain Management Visit Note  Patient: Susan Salinas, 35 y.o., female  "Hello [facility] am [facility]. We have an anesthesia team available at all times to provide care throughout the hospital, including epidural management and anesthesia for C-section. I don't know your plan for the delivery whether it a natural birth, water birth, IV sedation, nitrous supplementation, doula or epidural, but we want to meet your pain goals."   1.Was your pain managed to your expectations on prior hospitalizations?   Yes   2.What is your expectation for pain management during this hospitalization?     Epidural  3.How can we help you reach that goal?   Record the patient's initial score and the patient's pain goal.   Pain: 8  Pain Goal: 3 [facility] wants you to be able to say your pain was always managed very well.  Susan Salinas,Susan Salinas 05/30/2016

## 2016-05-30 NOTE — Anesthesia Preprocedure Evaluation (Signed)

## 2016-05-30 NOTE — Anesthesia Procedure Notes (Signed)

## 2016-05-30 NOTE — MAU Note (Signed)
Pt states that her underwear has been damp since about 0600 this morning.  Pt states that around 0500 she started having contractions that were 5 minutes apart.  Pt states she has a little bleeding.  Pt states she is feeling the baby move.

## 2016-05-30 NOTE — Progress Notes (Signed)
Ronna PolioJennifer Clairmont is a 35 y.o. G2P1001 at 3659w5d by ultrasound admitted for induction of labor due to Non-reactive NST.  Subjective:   Objective: BP 113/67 (BP Location: Right Arm)   Pulse 74   Temp 98.7 F (37.1 C) (Oral)   Resp 16   Ht 5\' 1"  (1.549 m)   Wt 166 lb (75.3 kg)   LMP 09/02/2015   SpO2 99%   BMI 31.37 kg/m  No intake/output data recorded. No intake/output data recorded.  FHT:  FHR: 145 bpm, variability: moderate,  accelerations:  Present,  decelerations:  Absent UC:   irregular, every 8-10 minutes SVE:   Dilation: 3 Effacement (%): 60 Station: -2 Exam by:: Doloris HallJenny Middleton RN  Labs: Lab Results  Component Value Date   WBC 17.1 (H) 05/30/2016   HGB 12.3 05/30/2016   HCT 34.7 (L) 05/30/2016   MCV 85.0 05/30/2016   PLT 187 05/30/2016    Assessment / Plan: Induction of labor due to non-reassuring fetal testing,  progressing well on pitocin  Labor: will start on pit aug of labor Preeclampsia:  no signs or symptoms of toxicity Fetal Wellbeing:  Category I Pain Control:  Epidural I/D:  n/a Anticipated MOD:  NSVD  Wyvonnia DuskyMarie Lawson 05/30/2016, 11:21 AM

## 2016-05-30 NOTE — H&P (Signed)
LABOR AND DELIVERY ADMISSION HISTORY AND PHYSICAL NOTE  Susan Salinas is a 35 y.o. female G2P1001 with IUP at 393w5d by LMP c/w early US presenting for Leaking fluid and contractions.   Patient states this AM at about 0500 she noticed her underwear was damp and has been continually leaking fluid since. She states she started having contractions for the past couple days but they got a lot stronger last night and then this morning she felt too uncomfortable during the contractions, and with the leaking fluid, came to be evaluated.   She reports positive fetal movement. She denies vaginal bleeding.  Prenatal History/Complications: Uncomplicated  Past Medical History: Past Medical History:  Diagnosis Date  . Abnormal Pap smear of cervix   . Contraceptive management 07/31/2013  . Depression   . History of abnormal cervical Pap smear 07/31/2013  . HPV (human papilloma virus) infection   . Nausea and vomiting during pregnancy 10/08/2015  . Pregnant 10/08/2015    Past Surgical History: Past Surgical History:  Procedure Laterality Date  . COLPOSCOPY      Obstetrical History: OB History    Gravida Para Term Preterm AB Living   2 1 1    0 1   SAB TAB Ectopic Multiple Live Births     0     1      Social History: Social History   Social History  . Marital status: Single    Spouse name: N/A  . Number of children: N/A  . Years of education: N/A   Social History Main Topics  . Smoking status: Never Smoker  . Smokeless tobacco: Never Used  . Alcohol use No  . Drug use: No  . Sexual activity: Not Currently    Birth control/ protection: None   Other Topics Concern  . None   Social History Narrative  . None    Family History: Family History  Problem Relation Age of Onset  . COPD Paternal Grandmother     Allergies: No Known Allergies  Prescriptions Prior to Admission  Medication Sig Dispense Refill Last Dose  . Doxylamine-Pyridoxine 10-10 MG TBEC 2 PO qhs; may take 1po  in am and 1po in afternoon prn nausea 120 tablet 3 Taking  . fluvoxaMINE (LUVOX) 100 MG tablet Take 100 mg by mouth as needed.    Taking  . prenatal vitamin w/FE, FA (PRENATAL 1 + 1) 27-1 MG TABS tablet Take 1 tablet by mouth daily at 12 noon. 30 each 11 Taking     Review of Systems   All systems reviewed and negative except as stated in HPI  Blood pressure 130/82, pulse 72, temperature 98.7 F (37.1 C), temperature source Oral, resp. rate 16, height 5\' 1"  (1.549 m), weight 166 lb (75.3 kg), last menstrual period 09/02/2015, SpO2 99 %. General appearance: alert, cooperative, appears stated age and no distress Lungs: clear to auscultation bilaterally Heart: regular rate and rhythm Abdomen: soft, non-tender; bowel sounds normal Extremities: No calf swelling or tenderness Presentation: cephalic Fetal monitoring: Cat I Uterine activity: q3-715min Dilation: 2 Effacement (%): 60 Station: -3 Exam by:: Dr. Omer JackMumaw   Prenatal labs: ABO, Rh: O/Negative/-- (05/02 1726) Antibody: Negative (08/30 0902) Rubella: !Error! RPR: Non Reactive (08/30 0902)  HBsAg: Negative (05/02 1726)  HIV: Non Reactive (08/30 0902)  GBS: Negative (11/02 1400)  2 hr Glucola: NML 72/132/149 Genetic screening:  NEG Anatomy US: Normal, female  Prenatal Transfer Tool  Maternal Diabetes: No Genetic Screening: Normal Maternal Ultrasounds/Referrals: Normal Fetal Ultrasounds or other Referrals:  None Maternal Substance Abuse:  No Significant Maternal Medications:  None Significant Maternal Lab Results: Lab values include: Group B Strep negative, Rh negative  Results for orders placed or performed during the hospital encounter of 05/30/16 (from the past 24 hour(s))  Fern Test   Collection Time: 05/30/16  7:46 AM  Result Value Ref Range   POCT Fern Test Positive = ruptured amniotic membanes   CBC   Collection Time: 05/30/16  8:15 AM  Result Value Ref Range   WBC 17.1 (H) 4.0 - 10.5 K/uL   RBC 4.08 3.87 - 5.11  MIL/uL   Hemoglobin 12.3 12.0 - 15.0 g/dL   HCT 16.134.7 (L) 09.636.0 - 04.546.0 %   MCV 85.0 78.0 - 100.0 fL   MCH 30.1 26.0 - 34.0 pg   MCHC 35.4 30.0 - 36.0 g/dL   RDW 40.913.2 81.111.5 - 91.415.5 %   Platelets 187 150 - 400 K/uL    Patient Active Problem List   Diagnosis Date Noted  . Normal labor 05/30/2016  . Rh negative state in antepartum period 11/26/2015  . Supervision of normal pregnancy 10/08/2015  . Nausea and vomiting during pregnancy 10/08/2015  . Depression 10/08/2015  . Dysplasia of cervix, low grade (CIN 1) 08/15/2013    Assessment: Susan PolioJennifer Salinas is a 35 y.o. G2P1001 at 5714w5d here for SROM/SOL.  #Labor: SOL, augmentation with pitocin if contractions decrease #Pain: Epidural on request #FWB: Cat I #ID:  GBS Neg; Rh Neg- postpartum Rhogam if indicated #MOF: Bottle #MOC:Postpartum tubal ligation #Circ:  Outpatient  Susan MowElizabeth Carlyann Placide, DO OB Fellow [facility] 05/30/2016, 8:49 AM

## 2016-05-31 MED ORDER — IBUPROFEN 600 MG PO TABS: 600.0000 mg | ORAL_TABLET | Freq: Four times a day (QID) | ORAL | 0 refills | Status: DC

## 2016-05-31 MED ORDER — NYSTATIN 100000 UNIT/GM EX POWD
Freq: Three times a day (TID) | CUTANEOUS | Status: DC
  Administered 2016-05-31: 16:00:00 via TOPICAL
  Filled 2016-05-31: qty 15

## 2016-05-31 MED ORDER — RHO D IMMUNE GLOBULIN 1500 UNIT/2ML IJ SOSY
300.0000 ug | PREFILLED_SYRINGE | Freq: Once | INTRAMUSCULAR | Status: AC
  Administered 2016-05-31: 300 ug via INTRAVENOUS
  Filled 2016-05-31: qty 2

## 2016-05-31 NOTE — Anesthesia Postprocedure Evaluation (Signed)
Anesthesia Post Note  Patient: Susan PolioJennifer Salinas  Procedure(s) Performed: * No procedures listed *  Patient location during evaluation: Mother Baby Anesthesia Type: Epidural Pain management: pain level controlled Vital Signs Assessment: post-procedure vital signs reviewed and stable Respiratory status: spontaneous breathing, nonlabored ventilation and respiratory function stable Cardiovascular status: stable Postop Assessment: no headache, no backache, patient able to bend at knees, no signs of nausea or vomiting and adequate PO intake Anesthetic complications: no     Last Vitals:  Vitals:   05/30/16 2319 05/31/16 0645  BP: 122/79 130/86  Pulse: 84 71  Resp: 20 16  Temp: 36.4 C 36.6 C    Last Pain:  Vitals:   05/31/16 0645  TempSrc: Oral  PainSc:    Pain Goal: Patients Stated Pain Goal: 0 (05/30/16 1824)               Jakaylee Sasaki

## 2016-05-31 NOTE — Progress Notes (Signed)
MOB was referred for history of depression/anxiety. * Referral screened out by Clinical Social Worker because none of the following criteria appear to apply: ~ History of anxiety/depression during this pregnancy, or of post-partum depression. ~ Diagnosis of anxiety and/or depression within last 3 years OR * MOB's symptoms currently being treated with medication and/or therapy. Please contact the Clinical Social Worker if needs arise, or if MOB requests.  Janey Petron Boyd-Gilyard, MSW, LCSW Clinical Social Work [phone] 

## 2016-05-31 NOTE — Discharge Summary (Signed)
OB Discharge Summary     Patient Name: Susan Salinas DOB: 06/25/1981 MRN: [id number]  Date of admission: 05/30/2016 Delivering MD: Wyvonnia DuskyLAWSON, MARIE D   Date of discharge: 05/31/2016  Admitting diagnosis: 39Wks water broke Intrauterine pregnancy: 582w5d     Secondary diagnosis:  Active Problems:   Normal labor  Additional problems: none     Discharge diagnosis: Term Pregnancy Delivered                                                                                                Post partum procedures:rhogam  Augmentation: Pitocin  Complications: None  Hospital course:  Onset of Labor With Vaginal Delivery     35 y.o. yo [id number] at 572w5d was admitted in Latent Labor on 05/30/2016. Patient had an uncomplicated labor course as follows:  Membrane Rupture Time/Date: 6:00 AM ,05/30/2016   Intrapartum Procedures: Episiotomy: None [1]                                         Lacerations:  None [1]  Patient had a delivery of a Viable infant. 05/30/2016  Information for the patient's newborn:  Susan Salinas, Boy Levern [[id number]][[id number]]  Delivery Method: Vaginal, Spontaneous Delivery (Filed from Delivery Summary)    Pateint had an uncomplicated postpartum course.  She is ambulating, tolerating a regular diet, passing flatus, and urinating well. Patient is discharged home in stable condition on 05/31/16.    Physical exam Vitals:   05/30/16 1720 05/30/16 1824 05/30/16 2319 05/31/16 0645  BP: 134/73 125/86 122/79 130/86  Pulse: 77 76 84 71  Resp: 16 18 20 16   Temp: 98.4 F (36.9 C) 98.5 F (36.9 C) 97.6 F (36.4 C) 97.8 F (36.6 C)  TempSrc: Oral Oral Oral Oral  SpO2: 100%     Weight:      Height:       General: alert, cooperative and no distress Lochia: appropriate Uterine Fundus: firm DVT Evaluation: No evidence of DVT seen on physical exam. Negative Homan's sign. No cords or calf tenderness. No significant calf/ankle edema. Labs: Lab Results  Component Value Date   WBC  17.1 (H) 05/30/2016   HGB 12.3 05/30/2016   HCT 34.7 (L) 05/30/2016   MCV 85.0 05/30/2016   PLT 187 05/30/2016   No flowsheet data found.  Discharge instruction: per After Visit Summary and "Baby and Me Booklet".  After visit meds:    Medication List    STOP taking these medications   amphetamine-dextroamphetamine 10 MG tablet Commonly known as:  ADDERALL   clonazePAM 2 MG tablet Commonly known as:  KLONOPIN   Doxylamine-Pyridoxine 10-10 MG Tbec     TAKE these medications   fluvoxaMINE 100 MG tablet Commonly known as:  LUVOX Take 100 mg by mouth as needed.   ibuprofen 600 MG tablet Commonly known as:  ADVIL,MOTRIN Take 1 tablet (600 mg total) by mouth every 6 (six) hours.   prenatal vitamin w/FE, FA 27-1 MG Tabs tablet Take 1 tablet by mouth daily at  12 noon.       Diet: routine diet  Activity: Advance as tolerated. Pelvic rest for 6 weeks.   Outpatient follow up:6 weeks Follow up Appt:Future Appointments Date Time Provider Department Center  07/06/2016 10:00 AM Jacklyn ShellFrances Cresenzo-Dishmon, CNM [facility]   Follow up Visit:No Follow-up on file.  Postpartum contraception: IUD Paragard  Newborn Data: Live born female  Birth Weight: 6 lb 12.1 oz (3065 g) APGAR: 8, 9  Baby Feeding: Bottle Disposition:home with mother   05/31/2016 Susan Salinas, CNM

## 2016-05-31 NOTE — Progress Notes (Signed)
Post Partum Day 1 Subjective: no complaints, up ad lib, voiding and tolerating PO  Objective: Blood pressure 130/86, pulse 71, temperature 97.8 F (36.6 C), temperature source Oral, resp. rate 16, height 5\' 1"  (1.549 m), weight 166 lb (75.3 kg), last menstrual period 09/02/2015, SpO2 100 %, unknown if currently breastfeeding.  Physical Exam:  General: alert, cooperative, appears stated age and no distress Lochia: appropriate Uterine Fundus: firm Incision: n/a DVT Evaluation: No evidence of DVT seen on physical exam.   Recent Labs  05/30/16 0815  HGB 12.3  HCT 34.7*    Assessment/Plan: Plan for discharge tomorrow   LOS: 1 day   Susan Salinas 05/31/2016, 7:04 AM

## 2016-05-31 NOTE — Progress Notes (Signed)
UR chart review completed.  

## 2016-06-01 LAB — RH IG WORKUP (INCLUDES ABO/RH)
ABO/RH(D): O NEG
Fetal Screen: NEGATIVE
GESTATIONAL AGE(WKS): 38
UNIT DIVISION: 0

## 2016-06-02 LAB — TYPE AND SCREEN
ABO/RH(D): O NEG
Antibody Screen: POSITIVE
DAT, IGG: NEGATIVE
UNIT DIVISION: 0
Unit division: 0

## 2016-06-03 ENCOUNTER — Encounter: Payer: Medicaid Other | Admitting: Obstetrics and Gynecology

## 2016-06-07 ENCOUNTER — Ambulatory Visit (INDEPENDENT_AMBULATORY_CARE_PROVIDER_SITE_OTHER): Payer: Medicaid Other | Admitting: Obstetrics and Gynecology

## 2016-06-07 ENCOUNTER — Encounter: Payer: Self-pay | Admitting: Obstetrics and Gynecology

## 2016-06-07 VITALS — BP 140/90 | HR 77 | Wt 150.2 lb

## 2016-06-07 DIAGNOSIS — R3915 Urgency of urination: Secondary | ICD-10-CM | POA: Diagnosis not present

## 2016-06-07 DIAGNOSIS — N3001 Acute cystitis with hematuria: Secondary | ICD-10-CM

## 2016-06-07 LAB — POCT URINALYSIS DIPSTICK
GLUCOSE UA: NEGATIVE
KETONES UA: NEGATIVE
Leukocytes, UA: NEGATIVE
Nitrite, UA: NEGATIVE
Protein, UA: 1
RBC UA: 3

## 2016-06-07 MED ORDER — SULFAMETHOXAZOLE-TRIMETHOPRIM 400-80 MG PO TABS: 1.0000 | ORAL_TABLET | Freq: Two times a day (BID) | ORAL | 0 refills | Status: DC

## 2016-06-07 MED ORDER — PHENAZOPYRIDINE HCL 200 MG PO TABS: 200.0000 mg | ORAL_TABLET | Freq: Three times a day (TID) | ORAL | 0 refills | Status: DC | PRN

## 2016-06-07 NOTE — Progress Notes (Signed)
   [facility] Visit  @DATE @            Patient name: Susan PolioJennifer Salinas MRN [id number]  Date of birth: 23-May-1981  CC & HPI:  Susan PolioJennifer Salinas is a 35 y.o. female presenting today for persistent, unchanged urinary urgency with little urine output. She also reports stress incontinence with coughing.   ROS:  ROS +urgency +little urine output +stress incontinence with coughing   Pertinent History Reviewed:   Reviewed: Significant for HPV and abnormal pap smear Medical         Past Medical History:  Diagnosis Date  . Abnormal Pap smear of cervix   . Contraceptive management 07/31/2013  . Depression   . History of abnormal cervical Pap smear 07/31/2013  . HPV (human papilloma virus) infection   . Nausea and vomiting during pregnancy 10/08/2015  . Pregnant 10/08/2015                              Surgical Hx:    Past Surgical History:  Procedure Laterality Date  . COLPOSCOPY     Medications: Reviewed & Updated - see associated section                       Current Outpatient Prescriptions:  .  fluticasone (FLONASE) 50 MCG/ACT nasal spray, Place 1 spray into both nostrils daily., Disp: , Rfl:  .  fluvoxaMINE (LUVOX) 100 MG tablet, Take 100 mg by mouth as needed. , Disp: , Rfl:  .  ibuprofen (ADVIL,MOTRIN) 600 MG tablet, Take 1 tablet (600 mg total) by mouth every 6 (six) hours., Disp: 30 tablet, Rfl: 0 .  Phenylephrine-Ibuprofen (ADVIL SINUS CONGESTION & PAIN PO), Take 1 tablet by mouth daily as needed., Disp: , Rfl:  .  prenatal vitamin w/FE, FA (PRENATAL 1 + 1) 27-1 MG TABS tablet, Take 1 tablet by mouth daily at 12 noon. (Patient taking differently: Take 1 tablet by mouth daily at 12 noon. ), Disp: 30 each, Rfl: 11   Social History: Reviewed -  reports that she has never smoked. She has never used smokeless tobacco.  Objective Findings:  Vitals: Blood pressure 140/90, pulse 77, weight 150 lb 3.2 oz (68.1 kg), last menstrual period 09/02/2015, not currently  breastfeeding.  Physical Examination: not indicated   Assessment & Plan:   A:  1. UTI  P:  1. Start Septra DS x 5 days BID and pyridium 200 mg TID x 5 days     By signing my name below, I, Sonum Patel, attest that this documentation has been prepared under the direction and in the presence of Tilda BurrowJohn V Carmelita Amparo, MD. Electronically Signed: Sonum Patel, Neurosurgeoncribe. 06/07/16. 1:54 PM.  I personally performed the services described in this documentation, which was SCRIBED in my presence. The recorded information has been reviewed and considered accurate. It has been edited as necessary during review. Tilda BurrowFERGUSON,Carmella Kees V, MD

## 2016-06-09 LAB — URINE CULTURE: ORGANISM ID, BACTERIA: NO GROWTH

## 2016-07-06 ENCOUNTER — Ambulatory Visit: Payer: Medicaid Other | Admitting: Advanced Practice Midwife

## 2016-07-28 ENCOUNTER — Ambulatory Visit: Payer: Medicaid Other | Admitting: Adult Health

## 2016-07-28 ENCOUNTER — Encounter: Payer: Self-pay | Admitting: *Deleted

## 2016-10-13 ENCOUNTER — Ambulatory Visit: Payer: Medicaid Other | Admitting: Adult Health

## 2016-10-18 ENCOUNTER — Ambulatory Visit: Payer: Medicaid Other | Admitting: Adult Health

## 2016-10-18 ENCOUNTER — Encounter: Payer: Self-pay | Admitting: *Deleted

## 2016-12-16 ENCOUNTER — Other Ambulatory Visit: Payer: Medicaid Other | Admitting: Women's Health

## 2016-12-29 ENCOUNTER — Other Ambulatory Visit: Payer: Medicaid Other | Admitting: Adult Health

## 2016-12-29 ENCOUNTER — Encounter: Payer: Self-pay | Admitting: Adult Health

## 2017-11-07 ENCOUNTER — Ambulatory Visit (INDEPENDENT_AMBULATORY_CARE_PROVIDER_SITE_OTHER): Payer: Medicaid Other | Admitting: Women's Health

## 2017-11-07 ENCOUNTER — Other Ambulatory Visit (HOSPITAL_COMMUNITY)
Admission: RE | Admit: 2017-11-07 | Discharge: 2017-11-07 | Disposition: A | Discharge status: Home / Self Care | Payer: Medicaid Other | Source: Ambulatory Visit | Attending: Adult Health | Admitting: Adult Health

## 2017-11-07 ENCOUNTER — Encounter: Payer: Self-pay | Admitting: Women's Health

## 2017-11-07 VITALS — BP 110/80 | HR 68 | Ht 61.5 in | Wt 142.0 lb

## 2017-11-07 DIAGNOSIS — N6320 Unspecified lump in the left breast, unspecified quadrant: Secondary | ICD-10-CM

## 2017-11-07 DIAGNOSIS — Z0001 Encounter for general adult medical examination with abnormal findings: Secondary | ICD-10-CM

## 2017-11-07 DIAGNOSIS — Z01419 Encounter for gynecological examination (general) (routine) without abnormal findings: Secondary | ICD-10-CM

## 2017-11-07 DIAGNOSIS — Z8742 Personal history of other diseases of the female genital tract: Secondary | ICD-10-CM | POA: Diagnosis not present

## 2017-11-07 DIAGNOSIS — Z124 Encounter for screening for malignant neoplasm of cervix: Secondary | ICD-10-CM

## 2017-11-07 DIAGNOSIS — N6323 Unspecified lump in the left breast, lower outer quadrant: Secondary | ICD-10-CM | POA: Diagnosis not present

## 2017-11-07 NOTE — Progress Notes (Signed)
   WELL-WOMAN EXAMINATION Patient name: Susan PolioJennifer Salinas MRN [id number]  Date of birth: [birth year] Chief Complaint:   Gynecologic Exam  History of Present Illness:   Susan Salinas is a 37 y.o. 672P2002 Caucasian female being seen today for a routine well-woman exam.  Current complaints: lump Lt breast x 1 month, no changes. No family h/o breast cancer.   PCP: [facility]      does not desire labs Patient's last menstrual period was 10/10/2017. The current method of family planning is condoms  Last pap 11/05/15. Results were: neg w/ +HRHPV Last mammogram: never. Results were: n/a Last colonoscopy: never. Results were: n/a  Review of Systems:   Pertinent items are noted in HPI Denies any headaches, blurred vision, fatigue, shortness of breath, chest pain, abdominal pain, abnormal vaginal discharge/itching/odor/irritation, problems with periods, bowel movements, urination, or intercourse unless otherwise stated above. Pertinent History Reviewed:  Reviewed past medical,surgical, social and family history.  Reviewed problem list, medications and allergies. Physical Assessment:   Vitals:   11/07/17 0951  BP: 110/80  Pulse: 68  Weight: 142 lb (64.4 kg)  Height: 5' 1.5" (1.562 m)  Body mass index is 26.4 kg/m.        Physical Examination: by Tonia GhentKatie Woods, SNP  General appearance - well appearing, and in no distress  Mental status - alert, oriented to person, place, and time  Psych:  She has a normal mood and affect  Skin - warm and dry, normal color, no suspicious lesions noted  Chest - effort normal, all lung fields clear to auscultation bilaterally  Heart - normal rate and regular rhythm  Neck:  midline trachea, no thyromegaly or nodules  Breasts - Rt breast appears normal, no suspicious masses, no skin or nipple changes or  axillary nodes; Lt breast- 1cm smooth oval mass at 5 o'clock directly below nipple  Abdomen - soft, nontender, nondistended, no masses or organomegaly  Pelvic - VULVA:  normal appearing vulva with no masses, tenderness or lesions  VAGINA: normal appearing vagina with normal color and discharge, no lesions  CERVIX: normal appearing cervix without discharge or lesions, no CMT  Thin prep pap is done w/ HR HPV cotesting  UTERUS: uterus is felt to be normal size, shape, consistency and nontender   ADNEXA: No adnexal masses or tenderness noted.  Extremities:  No swelling or varicosities noted  No results found for this or any previous visit (from the past 24 hour(s)).  Assessment & Plan:  1) Well-Woman Exam  2) H/O +HRHPV> with inadequate follow-up  3) Lt breast mass> scheduled diagnostic mammo, bilateral u/s at [facility] 4/30 @ 0900, be there @ 0845, no lotion/deoderant/powder/perfume that am  Labs/procedures today: pap  Mammogram as scheduled  Colonoscopy @35yo  or sooner if problems  Orders Placed This Encounter  Procedures  . MM DIAG BREAST TOMO BILATERAL  . US BREAST LTD UNI LEFT INC AXILLA  . US BREAST LTD UNI RIGHT INC AXILLA    Follow-up: Return in about 1 year (around 11/08/2018) for Physical.  Cheral MarkerKimberly R Alexcia Schools CNM, WHNP-BC 11/07/2017 10:49 AM

## 2017-11-07 NOTE — Patient Instructions (Signed)
Breast ultrasound and mamm[facility]30 @ 9:[facility]am [facility], be there at 8:45am, no lotion/deoderant/powder/perfume that day

## 2017-11-09 LAB — CYTOLOGY - PAP
Chlamydia: NEGATIVE
Diagnosis: NEGATIVE
HPV (WINDOPATH): NOT DETECTED
Neisseria Gonorrhea: NEGATIVE

## 2017-11-15 ENCOUNTER — Encounter (HOSPITAL_COMMUNITY): Payer: Self-pay

## 2017-11-15 ENCOUNTER — Ambulatory Visit (HOSPITAL_COMMUNITY)
Admission: RE | Admit: 2017-11-15 | Discharge: 2017-11-15 | Disposition: A | Discharge status: Home / Self Care | Payer: Medicaid Other | Source: Ambulatory Visit | Attending: Women's Health | Admitting: Women's Health

## 2017-11-15 DIAGNOSIS — N6312 Unspecified lump in the right breast, upper inner quadrant: Secondary | ICD-10-CM | POA: Diagnosis not present

## 2017-11-15 DIAGNOSIS — N6320 Unspecified lump in the left breast, unspecified quadrant: Secondary | ICD-10-CM | POA: Diagnosis present

## 2017-11-15 DIAGNOSIS — N6323 Unspecified lump in the left breast, lower outer quadrant: Secondary | ICD-10-CM | POA: Diagnosis not present

## 2018-05-17 ENCOUNTER — Other Ambulatory Visit: Payer: Self-pay | Admitting: Obstetrics & Gynecology

## 2018-05-17 DIAGNOSIS — O3680X0 Pregnancy with inconclusive fetal viability, not applicable or unspecified: Secondary | ICD-10-CM

## 2018-05-18 ENCOUNTER — Ambulatory Visit (INDEPENDENT_AMBULATORY_CARE_PROVIDER_SITE_OTHER): Payer: Medicaid Other

## 2018-05-18 DIAGNOSIS — O3680X0 Pregnancy with inconclusive fetal viability, not applicable or unspecified: Secondary | ICD-10-CM | POA: Diagnosis not present

## 2018-05-18 NOTE — Progress Notes (Signed)
[place] 6+5 wks,single IUP w/ys,positive fht 132 bpm,normal ovaries bilat,crl 10.7 mm

## 2018-05-22 ENCOUNTER — Telehealth: Payer: Self-pay | Admitting: Women's Health

## 2018-05-22 MED ORDER — PROMETHAZINE HCL 25 MG PO TABS: 25.0000 mg | ORAL_TABLET | Freq: Four times a day (QID) | ORAL | 1 refills | Status: DC | PRN

## 2018-05-22 NOTE — Telephone Encounter (Signed)
Pt called requesting a prescription for nausea medication. Advised that I would send her request to a provider and she could check with her pharmacy in the next 24 hours.

## 2018-05-22 NOTE — Telephone Encounter (Signed)
Please call pt she has new ob appt on the 14th has had [place] can we call in something for nausea/ [facility]

## 2018-05-22 NOTE — Telephone Encounter (Signed)
Pt is pregnant and having nausea and vomiting, will rx phenergan

## 2018-05-23 ENCOUNTER — Telehealth: Payer: Self-pay | Admitting: *Deleted

## 2018-05-23 MED ORDER — DOXYLAMINE-PYRIDOXINE 10-10 MG PO TBEC: DELAYED_RELEASE_TABLET | ORAL | 3 refills | Status: DC

## 2018-05-23 NOTE — Telephone Encounter (Signed)
Patient states she is not able to take the Phenergan.  She needs to work and is unable to as it makes her barely able to hold her head up.  She is requesting Diclegis.  Please advise.

## 2018-05-23 NOTE — Telephone Encounter (Signed)
Will rx diclegis and can take senokot, colace, or drink prune juice to help with constipation

## 2018-06-01 ENCOUNTER — Ambulatory Visit: Payer: Medicaid Other | Admitting: *Deleted

## 2018-06-01 ENCOUNTER — Encounter: Payer: Self-pay | Admitting: Women's Health

## 2018-06-01 ENCOUNTER — Ambulatory Visit (INDEPENDENT_AMBULATORY_CARE_PROVIDER_SITE_OTHER): Payer: Medicaid Other | Admitting: Women's Health

## 2018-06-01 VITALS — BP 113/75 | HR 90 | Wt 137.0 lb

## 2018-06-01 DIAGNOSIS — Z3A08 8 weeks gestation of pregnancy: Secondary | ICD-10-CM

## 2018-06-01 DIAGNOSIS — N870 Mild cervical dysplasia: Secondary | ICD-10-CM

## 2018-06-01 DIAGNOSIS — Z331 Pregnant state, incidental: Secondary | ICD-10-CM

## 2018-06-01 DIAGNOSIS — Z3481 Encounter for supervision of other normal pregnancy, first trimester: Secondary | ICD-10-CM | POA: Diagnosis not present

## 2018-06-01 DIAGNOSIS — Z1389 Encounter for screening for other disorder: Secondary | ICD-10-CM

## 2018-06-01 DIAGNOSIS — Z3490 Encounter for supervision of normal pregnancy, unspecified, unspecified trimester: Secondary | ICD-10-CM | POA: Insufficient documentation

## 2018-06-01 DIAGNOSIS — Z3682 Encounter for antenatal screening for nuchal translucency: Secondary | ICD-10-CM

## 2018-06-01 LAB — POCT URINALYSIS DIPSTICK OB
Glucose, UA: NEGATIVE
KETONES UA: NEGATIVE
LEUKOCYTES UA: NEGATIVE
NITRITE UA: NEGATIVE
RBC UA: NEGATIVE

## 2018-06-01 MED ORDER — CITALOPRAM HYDROBROMIDE 20 MG PO TABS: ORAL_TABLET | ORAL | 6 refills | Status: DC

## 2018-06-01 NOTE — Progress Notes (Signed)
INITIAL OBSTETRICAL VISIT Patient name: Susan Salinas MRN [id number]  Date of birth: 03-04-1981 Chief Complaint:   Initial Prenatal Visit  History of Present Illness:   Susan Salinas is a 37 y.o. G30P2002 Caucasian female at [redacted]w[redacted]d by LMP c/w 7wk u/s, with an Estimated Date of Delivery: 01/06/19 being seen today for her initial obstetrical visit.   Her obstetrical history is significant for term uncomplicated SVB x 2.   Today she reports stopped all meds w/ +PT. Was on adderral, klonopin. Main thing is anxiety, and feels angry all the time. Denies SI/HI.  Patient's last menstrual period was 04/01/2018 (exact date). Last pap 11/07/17. Results were: neg w/ -HRHPV Review of Systems:   Pertinent items are noted in HPI Denies cramping/contractions, leakage of fluid, vaginal bleeding, abnormal vaginal discharge w/ itching/odor/irritation, headaches, visual changes, shortness of breath, chest pain, abdominal pain, severe nausea/vomiting, or problems with urination or bowel movements unless otherwise stated above.  Pertinent History Reviewed:  Reviewed past medical,surgical, social, obstetrical and family history.  Reviewed problem list, medications and allergies. OB History  Gravida Para Term Preterm AB Living  3 2 2    0 2  SAB TAB Ectopic Multiple Live Births    0   0 2    # Outcome Date GA Lbr Len/2nd Weight Sex Delivery Anes PTL Lv  3 Current           2 Term 05/30/16 [redacted]w[redacted]d 10:13 / 00:01 6 lb 12.1 oz (3.065 kg) M Vag-Spont EPI N LIV  1 Term 06/23/06 [redacted]w[redacted]d  7 lb 14 oz (3.572 kg) M Vag-Spont EPI N LIV   Physical Assessment:   Vitals:   06/01/18 1358  BP: 113/75  Pulse: 90  Weight: 137 lb (62.1 kg)  Body mass index is 25.47 kg/m.       Physical Examination:  General appearance - well appearing, and in no distress  Mental status - alert, oriented to person, place, and time  Psych:  She has a normal mood and affect  Skin - warm and dry, normal color, no suspicious lesions  noted  Chest - effort normal, all lung fields clear to auscultation bilaterally  Heart - normal rate and regular rhythm  Abdomen - soft, nontender  Extremities:  No swelling or varicosities noted  Thin prep pap is not done  Fetal Heart Rate (bpm): +u/s via informal transabdominal u/s  Results for orders placed or performed in visit on 06/01/18 (from the past 24 hour(s))  POC Urinalysis Dipstick OB   Collection Time: 06/01/18  2:21 PM  Result Value Ref Range   Color, UA     Clarity, UA     Glucose, UA Negative Negative   Bilirubin, UA     Ketones, UA neg    Spec Grav, UA     Blood, UA neg    pH, UA     POC,PROTEIN,UA Trace Negative, Trace, Small (1+), Moderate (2+), Large (3+), 4+   Urobilinogen, UA     Nitrite, UA neg    Leukocytes, UA Negative Negative   Appearance     Odor      Assessment & Plan:  1) Low-Risk Pregnancy G3P2002 at [redacted]w[redacted]d with an Estimated Date of Delivery: 01/06/19   2) Initial OB visit  3) Dep/anx> rx celexa, knows it can take few weeks to notice improvement  Meds:  Meds ordered this encounter  Medications  . citalopram (CELEXA) 20 MG tablet    Sig: Take 10mg  (1/2 tab) daily x 1  week, then 20mg  (1tab) daily thereafter    Dispense:  30 tablet    Refill:  6    Order Specific Question:   Supervising Provider    Answer:   Lazaro ArmsEURE, LUTHER H [[id number]]    Initial labs obtained Continue prenatal vitamins Reviewed n/v relief measures and warning s/s to report Reviewed recommended weight gain based on pre-gravid BMI Encouraged well-balanced diet Genetic Screening discussed Integrated Screen: requested Cystic fibrosis screening discussed neg prev preg Ultrasound discussed; fetal survey: declined CCNC completed>spoke w/ Tobi Bastosnna  Follow-up: Return in about 1 month (around 07/03/2018) for LROB, US:NT+1stIT.   Orders Placed This Encounter  Procedures  . GC/Chlamydia Probe Amp  . Urine Culture  . US Fetal Nuchal Translucency Measurement  . Urinalysis, Routine w  reflex microscopic  . Obstetric Panel, Including HIV  . Pain Management Screening Profile (10S)  . POC Urinalysis Dipstick OB    Cheral MarkerKimberly R Jona Erkkila CNM, [facility]NP-BC 06/01/2018 3:00 PM

## 2018-06-01 NOTE — Patient Instructions (Signed)
Susan PolioJennifer Salinas, I greatly value your feedback.  If you receive a survey following your visit with us today, we appreciate you taking the time to fill it out.  Thanks, Susan Salinas, CNM, WHNP-BC   Nausea & Vomiting  Have saltine crackers or pretzels by your bed and eat a few bites before you raise your head out of bed in the morning  Eat small frequent meals throughout the day instead of large meals  Drink plenty of fluids throughout the day to stay hydrated, just don't drink a lot of fluids with your meals.  This can make your stomach fill up faster making you feel sick  Do not brush your teeth right after you eat  Products with real ginger are good for nausea, like ginger ale and ginger hard candy Make sure it says made with real ginger!  Sucking on sour candy like lemon heads is also good for nausea  If your prenatal vitamins make you nauseated, take them at night so you will sleep through the nausea  Sea Bands  If you feel like you need medicine for the nausea & vomiting please let us know  If you are unable to keep any fluids or food down please let us know   Constipation  Drink plenty of fluid, preferably water, throughout the day  Eat foods high in fiber such as fruits, vegetables, and grains  Exercise, such as walking, is a good way to keep your bowels regular  Drink warm fluids, especially warm prune juice, or decaf coffee  Eat a 1/2 cup of real oatmeal (not instant), 1/2 cup applesauce, and 1/2-1 cup warm prune juice every day  If needed, you may take Colace (docusate sodium) stool softener once or twice a day to help keep the stool soft. If you are pregnant, wait until you are out of your first trimester (12-14 weeks of pregnancy)  If you still are having problems with constipation, you may take Miralax once daily as needed to help keep your bowels regular.  If you are pregnant, wait until you are out of your first trimester (12-14 weeks of pregnancy)   First  Trimester of Pregnancy The first trimester of pregnancy is from week 1 until the end of week 12 (months 1 through 3). A week after a sperm fertilizes an egg, the egg will implant on the wall of the uterus. This embryo will begin to develop into a baby. Genes from you and your partner are forming the baby. The female genes determine whether the baby is a boy or a girl. At 6-8 weeks, the eyes and face are formed, and the heartbeat can be seen on ultrasound. At the end of 12 weeks, all the baby's organs are formed.  Now that you are pregnant, you will want to do everything you can to have a healthy baby. Two of the most important things are to get good prenatal care and to follow your health care provider's instructions. Prenatal care is all the medical care you receive before the baby's birth. This care will help prevent, find, and treat any problems during the pregnancy and childbirth. BODY CHANGES Your body goes through many changes during pregnancy. The changes vary from woman to woman.   You may gain or lose a couple of pounds at first.  You may feel sick to your stomach (nauseous) and throw up (vomit). If the vomiting is uncontrollable, call your health care provider.  You may tire easily.  You may develop headaches that  can be relieved by medicines approved by your health care provider.  You may urinate more often. Painful urination may mean you have a bladder infection.  You may develop heartburn as a result of your pregnancy.  You may develop constipation because certain hormones are causing the muscles that push waste through your intestines to slow down.  You may develop hemorrhoids or swollen, bulging veins (varicose veins).  Your breasts may begin to grow larger and become tender. Your nipples may stick out more, and the tissue that surrounds them (areola) may become darker.  Your gums may bleed and may be sensitive to brushing and flossing.  Dark spots or blotches (chloasma, mask  of pregnancy) may develop on your face. This will likely fade after the baby is born.  Your menstrual periods will stop.  You may have a loss of appetite.  You may develop cravings for certain kinds of food.  You may have changes in your emotions from day to day, such as being excited to be pregnant or being concerned that something may go wrong with the pregnancy and baby.  You may have more vivid and strange dreams.  You may have changes in your hair. These can include thickening of your hair, rapid growth, and changes in texture. Some women also have hair loss during or after pregnancy, or hair that feels dry or thin. Your hair will most likely return to normal after your baby is born. WHAT TO EXPECT AT YOUR PRENATAL VISITS During a routine prenatal visit:  You will be weighed to make sure you and the baby are growing normally.  Your blood pressure will be taken.  Your abdomen will be measured to track your baby's growth.  The fetal heartbeat will be listened to starting around week 10 or 12 of your pregnancy.  Test results from any previous visits will be discussed. Your health care provider may ask you:  How you are feeling.  If you are feeling the baby move.  If you have had any abnormal symptoms, such as leaking fluid, bleeding, severe headaches, or abdominal cramping.  If you have any questions. Other tests that may be performed during your first trimester include:  Blood tests to find your blood type and to check for the presence of any previous infections. They will also be used to check for low iron levels (anemia) and Rh antibodies. Later in the pregnancy, blood tests for diabetes will be done along with other tests if problems develop.  Urine tests to check for infections, diabetes, or protein in the urine.  An ultrasound to confirm the proper growth and development of the baby.  An amniocentesis to check for possible genetic problems.  Fetal screens for spina  bifida and Down syndrome.  You may need other tests to make sure you and the baby are doing well. HOME CARE INSTRUCTIONS  Medicines  Follow your health care provider's instructions regarding medicine use. Specific medicines may be either safe or unsafe to take during pregnancy.  Take your prenatal vitamins as directed.  If you develop constipation, try taking a stool softener if your health care provider approves. Diet  Eat regular, well-balanced meals. Choose a variety of foods, such as meat or vegetable-based protein, fish, milk and low-fat dairy products, vegetables, fruits, and whole grain breads and cereals. Your health care provider will help you determine the amount of weight gain that is right for you.  Avoid raw meat and uncooked cheese. These carry germs that can cause  birth defects in the baby.  Eating four or five small meals rather than three large meals a day may help relieve nausea and vomiting. If you start to feel nauseous, eating a few soda crackers can be helpful. Drinking liquids between meals instead of during meals also seems to help nausea and vomiting.  If you develop constipation, eat more high-fiber foods, such as fresh vegetables or fruit and whole grains. Drink enough fluids to keep your urine clear or pale yellow. Activity and Exercise  Exercise only as directed by your health care provider. Exercising will help you:  Control your weight.  Stay in shape.  Be prepared for labor and delivery.  Experiencing pain or cramping in the lower abdomen or low back is a good sign that you should stop exercising. Check with your health care provider before continuing normal exercises.  Try to avoid standing for long periods of time. Move your legs often if you must stand in one place for a long time.  Avoid heavy lifting.  Wear low-heeled shoes, and practice good posture.  You may continue to have sex unless your health care provider directs you  otherwise. Relief of Pain or Discomfort  Wear a good support bra for breast tenderness.   Take warm sitz baths to soothe any pain or discomfort caused by hemorrhoids. Use hemorrhoid cream if your health care provider approves.   Rest with your legs elevated if you have leg cramps or low back pain.  If you develop varicose veins in your legs, wear support hose. Elevate your feet for 15 minutes, 3-4 times a day. Limit salt in your diet. Prenatal Care  Schedule your prenatal visits by the twelfth week of pregnancy. They are usually scheduled monthly at first, then more often in the last 2 months before delivery.  Write down your questions. Take them to your prenatal visits.  Keep all your prenatal visits as directed by your health care provider. Safety  Wear your seat belt at all times when driving.  Make a list of emergency phone numbers, including numbers for family, friends, the hospital, and police and fire departments. General Tips  Ask your health care provider for a referral to a local prenatal education class. Begin classes no later than at the beginning of month 6 of your pregnancy.  Ask for help if you have counseling or nutritional needs during pregnancy. Your health care provider can offer advice or refer you to specialists for help with various needs.  Do not use hot tubs, steam rooms, or saunas.  Do not douche or use tampons or scented sanitary pads.  Do not cross your legs for long periods of time.  Avoid cat litter boxes and soil used by cats. These carry germs that can cause birth defects in the baby and possibly loss of the fetus by miscarriage or stillbirth.  Avoid all smoking, herbs, alcohol, and medicines not prescribed by your health care provider. Chemicals in these affect the formation and growth of the baby.  Schedule a dentist appointment. At home, brush your teeth with a soft toothbrush and be gentle when you floss. SEEK MEDICAL CARE IF:   You have  dizziness.  You have mild pelvic cramps, pelvic pressure, or nagging pain in the abdominal area.  You have persistent nausea, vomiting, or diarrhea.  You have a bad smelling vaginal discharge.  You have pain with urination.  You notice increased swelling in your face, hands, legs, or ankles. SEEK IMMEDIATE MEDICAL CARE IF:  You have a fever.  You are leaking fluid from your vagina.  You have spotting or bleeding from your vagina.  You have severe abdominal cramping or pain.  You have rapid weight gain or loss.  You vomit blood or material that looks like coffee grounds.  You are exposed to [place] measles and have never had them.  You are exposed to fifth disease or chickenpox.  You develop a severe headache.  You have shortness of breath.  You have any kind of trauma, such as from a fall or a car accident. Document Released: 06/29/2001 Document Revised: 11/19/2013 Document Reviewed: 05/15/2013 [facility] Patient Information 2015 [place], [place]. This information is not intended to replace advice given to you by your health care provider. Make sure you discuss any questions you have with your health care provider.

## 2018-06-02 ENCOUNTER — Encounter: Payer: Self-pay | Admitting: Women's Health

## 2018-06-02 LAB — PMP SCREEN PROFILE (10S), URINE
AMPHETAMINE SCREEN URINE: NEGATIVE ng/mL
BARBITURATE SCREEN URINE: NEGATIVE ng/mL
BENZODIAZEPINE SCREEN, URINE: NEGATIVE ng/mL
CANNABINOIDS UR QL SCN: POSITIVE ng/mL — AB
Cocaine (Metab) Scrn, Ur: NEGATIVE ng/mL
Creatinine(Crt), U: 217.3 mg/dL (ref 20.0–300.0)
Methadone Screen, Urine: NEGATIVE ng/mL
OXYCODONE+OXYMORPHONE UR QL SCN: NEGATIVE ng/mL
Opiate Scrn, Ur: NEGATIVE ng/mL
PH UR, DRUG SCRN: 6.7 (ref 4.5–8.9)
PHENCYCLIDINE QUANTITATIVE URINE: NEGATIVE ng/mL
Propoxyphene Scrn, Ur: NEGATIVE ng/mL

## 2018-06-02 LAB — URINALYSIS, ROUTINE W REFLEX MICROSCOPIC
BILIRUBIN UA: NEGATIVE
Glucose, UA: NEGATIVE
LEUKOCYTES UA: NEGATIVE
NITRITE UA: NEGATIVE
RBC UA: NEGATIVE
Urobilinogen, Ur: 0.2 mg/dL (ref 0.2–1.0)
pH, UA: 6.5 (ref 5.0–7.5)

## 2018-06-02 LAB — OBSTETRIC PANEL, INCLUDING HIV
ANTIBODY SCREEN: NEGATIVE
BASOS ABS: 0 10*3/uL (ref 0.0–0.2)
BASOS: 0 %
EOS (ABSOLUTE): 0.6 10*3/uL — AB (ref 0.0–0.4)
Eos: 6 %
HEMATOCRIT: 38.3 % (ref 34.0–46.6)
HIV Screen 4th Generation wRfx: NONREACTIVE
Hemoglobin: 13.4 g/dL (ref 11.1–15.9)
Hepatitis B Surface Ag: NEGATIVE
IMMATURE GRANS (ABS): 0 10*3/uL (ref 0.0–0.1)
IMMATURE GRANULOCYTES: 0 %
LYMPHS: 19 %
Lymphocytes Absolute: 2 10*3/uL (ref 0.7–3.1)
MCH: 30.1 pg (ref 26.6–33.0)
MCHC: 35 g/dL (ref 31.5–35.7)
MCV: 86 fL (ref 79–97)
MONOCYTES: 7 %
Monocytes Absolute: 0.8 10*3/uL (ref 0.1–0.9)
NEUTROS PCT: 68 %
Neutrophils Absolute: 7.4 10*3/uL — ABNORMAL HIGH (ref 1.4–7.0)
Platelets: 283 10*3/uL (ref 150–450)
RBC: 4.45 x10E6/uL (ref 3.77–5.28)
RDW: 12.4 % (ref 12.3–15.4)
RH TYPE: NEGATIVE
RPR Ser Ql: NONREACTIVE
RUBELLA: 8.62 {index} (ref >0.99)
WBC: 10.9 10*3/uL — ABNORMAL HIGH (ref 3.4–10.8)

## 2018-06-03 LAB — URINE CULTURE

## 2018-06-03 LAB — GC/CHLAMYDIA PROBE AMP
CHLAMYDIA, DNA PROBE: NEGATIVE
Neisseria gonorrhoeae by PCR: NEGATIVE

## 2018-06-05 ENCOUNTER — Encounter: Payer: Self-pay | Admitting: Women's Health

## 2018-06-05 DIAGNOSIS — F1290 Cannabis use, unspecified, uncomplicated: Secondary | ICD-10-CM | POA: Insufficient documentation

## 2018-07-03 ENCOUNTER — Ambulatory Visit (INDEPENDENT_AMBULATORY_CARE_PROVIDER_SITE_OTHER): Payer: Medicaid Other

## 2018-07-03 ENCOUNTER — Encounter: Payer: Self-pay | Admitting: Obstetrics & Gynecology

## 2018-07-03 ENCOUNTER — Ambulatory Visit (INDEPENDENT_AMBULATORY_CARE_PROVIDER_SITE_OTHER): Payer: Medicaid Other | Admitting: Obstetrics & Gynecology

## 2018-07-03 VITALS — BP 120/80 | HR 76 | Wt 140.0 lb

## 2018-07-03 DIAGNOSIS — Z3A13 13 weeks gestation of pregnancy: Secondary | ICD-10-CM

## 2018-07-03 DIAGNOSIS — Z331 Pregnant state, incidental: Secondary | ICD-10-CM

## 2018-07-03 DIAGNOSIS — Z3682 Encounter for antenatal screening for nuchal translucency: Secondary | ICD-10-CM | POA: Diagnosis not present

## 2018-07-03 DIAGNOSIS — Z3481 Encounter for supervision of other normal pregnancy, first trimester: Secondary | ICD-10-CM

## 2018-07-03 DIAGNOSIS — Z1389 Encounter for screening for other disorder: Secondary | ICD-10-CM

## 2018-07-03 LAB — POCT URINALYSIS DIPSTICK OB
GLUCOSE, UA: NEGATIVE
Ketones, UA: NEGATIVE
Nitrite, UA: NEGATIVE
POC,PROTEIN,UA: NEGATIVE

## 2018-07-03 NOTE — Progress Notes (Signed)
US 13+2 wks,measurements c/w dates,normal ovaries bilat,NB present,NT 1.8 mm,crl 79.7365mm,fhr 153 bpm

## 2018-07-03 NOTE — Progress Notes (Signed)
   LOW-RISK PREGNANCY VISIT Patient name: Ronna PolioJennifer Westby MRN [id number]  Date of birth: March 07, 1981 Chief Complaint:   Routine Prenatal Visit (1st IT; both arms feel sore)  History of Present Illness:   Ronna PolioJennifer Fabela is a 37 y.o. 373P2002 female at 52105w2d with an Estimated Date of Delivery: 01/06/19 being seen today for ongoing management of a low-risk pregnancy.  Today she reports no complaints.  . Vag. Bleeding: None.   . denies leaking of fluid. Review of Systems:   Pertinent items are noted in HPI Denies abnormal vaginal discharge w/ itching/odor/irritation, headaches, visual changes, shortness of breath, chest pain, abdominal pain, severe nausea/vomiting, or problems with urination or bowel movements unless otherwise stated above. Pertinent History Reviewed:  Reviewed past medical,surgical, social, obstetrical and family history.  Reviewed problem list, medications and allergies. Physical Assessment:   Vitals:   07/03/18 1025  BP: 120/80  Pulse: 76  Weight: 140 lb (63.5 kg)  Body mass index is 26.02 kg/m.        Physical Examination:   General appearance: Well appearing, and in no distress  Mental status: Alert, oriented to person, place, and time  Skin: Warm & dry  Cardiovascular: Normal heart rate noted  Respiratory: Normal respiratory effort, no distress  Abdomen: Soft, gravid, nontender  Pelvic: Cervical exam deferred         Extremities: Edema: None  Fetal Status:          Results for orders placed or performed in visit on 07/03/18 (from the past 24 hour(s))  POC Urinalysis Dipstick OB   Collection Time: 07/03/18 10:25 AM  Result Value Ref Range   Color, UA     Clarity, UA     Glucose, UA Negative Negative   Bilirubin, UA     Ketones, UA neg    Spec Grav, UA     Blood, UA trace    pH, UA     POC,PROTEIN,UA Negative Negative, Trace, Small (1+), Moderate (2+), Large (3+), 4+   Urobilinogen, UA     Nitrite, UA neg    Leukocytes, UA Trace (A) Negative   Appearance     Odor      Assessment & Plan:  1) Low-risk pregnancy G3P2002 at 73105w2d with an Estimated Date of Delivery: 01/06/19   2) AMA   Meds: No orders of the defined types were placed in this encounter.  Labs/procedures today: IT today  Plan:  Continue routine obstetrical care   Reviewed: Preterm labor symptoms and general obstetric precautions including but not limited to vaginal bleeding, contractions, leaking of fluid and fetal movement were reviewed in detail with the patient.  All questions were answered  Follow-up: Return in about 4 weeks (around 07/31/2018) for LROB.  Orders Placed This Encounter  Procedures  . Integrated 1  . POC Urinalysis Dipstick OB   Lazaro ArmsLuther H Eure  07/03/2018 10:31 AM

## 2018-07-05 LAB — INTEGRATED 1
CROWN RUMP LENGTH MAT SCREEN: 79.7 mm
GEST. AGE ON COLLECTION DATE: 13.7 wk
Maternal Age at EDD: 36.2 yr
NUCHAL TRANSLUCENCY (NT): 1.8 mm
NUMBER OF FETUSES: 1
PAPP-A Value: 2063.6 ng/mL
Weight: 140 [lb_av]

## 2018-07-31 ENCOUNTER — Ambulatory Visit (INDEPENDENT_AMBULATORY_CARE_PROVIDER_SITE_OTHER): Payer: Medicaid Other | Admitting: Advanced Practice Midwife

## 2018-07-31 VITALS — BP 110/63 | HR 80 | Wt 142.0 lb

## 2018-07-31 DIAGNOSIS — Z1379 Encounter for other screening for genetic and chromosomal anomalies: Secondary | ICD-10-CM

## 2018-07-31 DIAGNOSIS — Z363 Encounter for antenatal screening for malformations: Secondary | ICD-10-CM

## 2018-07-31 DIAGNOSIS — Z3482 Encounter for supervision of other normal pregnancy, second trimester: Secondary | ICD-10-CM

## 2018-07-31 DIAGNOSIS — Z3A17 17 weeks gestation of pregnancy: Secondary | ICD-10-CM

## 2018-07-31 DIAGNOSIS — Z331 Pregnant state, incidental: Secondary | ICD-10-CM

## 2018-07-31 DIAGNOSIS — Z1389 Encounter for screening for other disorder: Secondary | ICD-10-CM

## 2018-07-31 LAB — POCT URINALYSIS DIPSTICK OB
Blood, UA: NEGATIVE
Glucose, UA: NEGATIVE
Ketones, UA: NEGATIVE
Leukocytes, UA: NEGATIVE
Nitrite, UA: NEGATIVE
POC,PROTEIN,UA: NEGATIVE

## 2018-07-31 NOTE — Patient Instructions (Signed)
Susan Salinas, I greatly value your feedback.  If you receive a survey following your visit with [place] today, we appreciate you taking the time to fill it out.  Thanks, Susan Salinas, CNM     Second Trimester of Pregnancy The second trimester is from week 14 through week 27 (months 4 through 6). The second trimester is often a time when you feel your best. Your body has adjusted to being pregnant, and you begin to feel better physically. Usually, morning sickness has lessened or quit completely, you may have more energy, and you may have an increase in appetite. The second trimester is also a time when the fetus is growing rapidly. At the end of the sixth month, the fetus is about 9 inches long and weighs about 1 pounds. You will likely begin to feel the baby move (quickening) between 16 and 20 weeks of pregnancy. Body changes during your second trimester Your body continues to go through many changes during your second trimester. The changes vary from woman to woman.  Your weight will continue to increase. You will notice your lower abdomen bulging out.  You may begin to get stretch marks on your hips, abdomen, and breasts.  You may develop headaches that can be relieved by medicines. The medicines should be approved by your health care provider.  You may urinate more often because the fetus is pressing on your bladder.  You may develop or continue to have heartburn as a result of your pregnancy.  You may develop constipation because certain hormones are causing the muscles that push waste through your intestines to slow down.  You may develop hemorrhoids or swollen, bulging veins (varicose veins).  You may have back pain. This is caused by: ? Weight gain. ? Pregnancy hormones that are relaxing the joints in your pelvis. ? A shift in weight and the muscles that support your balance.  Your breasts will continue to grow and they will continue to become tender.  Your gums may  bleed and may be sensitive to brushing and flossing.  Dark spots or blotches (chloasma, mask of pregnancy) may develop on your face. This will likely fade after the baby is born.  A dark line from your belly button to the pubic area (linea nigra) may appear. This will likely fade after the baby is born.  You may have changes in your hair. These can include thickening of your hair, rapid growth, and changes in texture. Some women also have hair loss during or after pregnancy, or hair that feels dry or thin. Your hair will most likely return to normal after your baby is born.  What to expect at prenatal visits During a routine prenatal visit:  You will be weighed to make sure you and the fetus are growing normally.  Your blood pressure will be taken.  Your abdomen will be measured to track your baby's growth.  The fetal heartbeat will be listened to.  Any test results from the previous visit will be discussed.  Your health care provider may ask you:  How you are feeling.  If you are feeling the baby move.  If you have had any abnormal symptoms, such as leaking fluid, bleeding, severe headaches, or abdominal cramping.  If you are using any tobacco products, including cigarettes, chewing tobacco, and electronic cigarettes.  If you have any questions.  Other tests that may be performed during your second trimester include:  Blood tests that check for: ? Low iron levels (anemia). ? High  blood sugar that affects pregnant women (gestational diabetes) between 42 and 28 weeks. ? Rh antibodies. This is to check for a protein on red blood cells (Rh factor).  Urine tests to check for infections, diabetes, or protein in the urine.  An ultrasound to confirm the proper growth and development of the baby.  An amniocentesis to check for possible genetic problems.  Fetal screens for spina bifida and Down syndrome.  HIV (human immunodeficiency virus) testing. Routine prenatal testing  includes screening for HIV, unless you choose not to have this test.  Follow these instructions at home: Medicines  Follow your health care provider's instructions regarding medicine use. Specific medicines may be either safe or unsafe to take during pregnancy.  Take a prenatal vitamin that contains at least 600 micrograms (mcg) of folic acid.  If you develop constipation, try taking a stool softener if your health care provider approves. Eating and drinking  Eat a balanced diet that includes fresh fruits and vegetables, whole grains, good sources of protein such as meat, eggs, or tofu, and low-fat dairy. Your health care provider will help you determine the amount of weight gain that is right for you.  Avoid raw meat and uncooked cheese. These carry germs that can cause birth defects in the baby.  If you have low calcium intake from food, talk to your health care provider about whether you should take a daily calcium supplement.  Limit foods that are high in fat and processed sugars, such as fried and sweet foods.  To prevent constipation: ? Drink enough fluid to keep your urine clear or pale yellow. ? Eat foods that are high in fiber, such as fresh fruits and vegetables, whole grains, and beans. Activity  Exercise only as directed by your health care provider. Most women can continue their usual exercise routine during pregnancy. Try to exercise for 30 minutes at least 5 days a week. Stop exercising if you experience uterine contractions.  Avoid heavy lifting, wear low heel shoes, and practice good posture.  A sexual relationship may be continued unless your health care provider directs you otherwise. Relieving pain and discomfort  Wear a good support bra to prevent discomfort from breast tenderness.  Take warm sitz baths to soothe any pain or discomfort caused by hemorrhoids. Use hemorrhoid cream if your health care provider approves.  Rest with your legs elevated if you have  leg cramps or low back pain.  If you develop varicose veins, wear support hose. Elevate your feet for 15 minutes, 3-4 times a day. Limit salt in your diet. Prenatal Care  Write down your questions. Take them to your prenatal visits.  Keep all your prenatal visits as told by your health care provider. This is important. Safety  Wear your seat belt at all times when driving.  Make a list of emergency phone numbers, including numbers for family, friends, the hospital, and police and fire departments. General instructions  Ask your health care provider for a referral to a local prenatal education class. Begin classes no later than the beginning of month 6 of your pregnancy.  Ask for help if you have counseling or nutritional needs during pregnancy. Your health care provider can offer advice or refer you to specialists for help with various needs.  Do not use hot tubs, steam rooms, or saunas.  Do not douche or use tampons or scented sanitary pads.  Do not cross your legs for long periods of time.  Avoid cat litter boxes and soil  used by cats. These carry germs that can cause birth defects in the baby and possibly loss of the fetus by miscarriage or stillbirth.  Avoid all smoking, herbs, alcohol, and unprescribed drugs. Chemicals in these products can affect the formation and growth of the baby.  Do not use any products that contain nicotine or tobacco, such as cigarettes and e-cigarettes. If you need help quitting, ask your health care provider.  Visit your dentist if you have not gone yet during your pregnancy. Use a soft toothbrush to brush your teeth and be gentle when you floss. Contact a health care provider if:  You have dizziness.  You have mild pelvic cramps, pelvic pressure, or nagging pain in the abdominal area.  You have persistent nausea, vomiting, or diarrhea.  You have a bad smelling vaginal discharge.  You have pain when you urinate. Get help right away if:  You  have a fever.  You are leaking fluid from your vagina.  You have spotting or bleeding from your vagina.  You have severe abdominal cramping or pain.  You have rapid weight gain or weight loss.  You have shortness of breath with chest pain.  You notice sudden or extreme swelling of your face, hands, ankles, feet, or legs.  You have not felt your baby move in over an hour.  You have severe headaches that do not go away when you take medicine.  You have vision changes. Summary  The second trimester is from week 14 through week 27 (months 4 through 6). It is also a time when the fetus is growing rapidly.  Your body goes through many changes during pregnancy. The changes vary from woman to woman.  Avoid all smoking, herbs, alcohol, and unprescribed drugs. These chemicals affect the formation and growth your baby.  Do not use any tobacco products, such as cigarettes, chewing tobacco, and e-cigarettes. If you need help quitting, ask your health care provider.  Contact your health care provider if you have any questions. Keep all prenatal visits as told by your health care provider. This is important. This information is not intended to replace advice given to you by your health care provider. Make sure you discuss any questions you have with your health care provider.      CHILDBIRTH CLASSES [phone] is the phone number for Pregnancy Classes or hospital tours at [place] will be referred to  [email or web address] for more information on childbirth classes  At this site you may register for classes. You may sign up for a waiting list if classes are full. Please SIGN UP FOR THIS!.   When the waiting list becomes long, sometimes new classes can be added.

## 2018-07-31 NOTE — Progress Notes (Signed)
  Susan Salinas [redacted]w[redacted]d Estimated Date of Delivery: 01/06/19  Blood pressure 110/63, pulse 80, weight 142 lb (64.4 kg), last menstrual period 04/01/2018.   BP weight and urine results all reviewed and noted.  Please refer to the obstetrical flow sheet for the fundal height and fetal heart rate documentation:  Patient reports good fetal movement, denies any bleeding and no rupture of membranes symptoms or regular contractions. Patient has had sinus pressure in face and temples for a few weeks, responds to tylenonl sinus medicine.  No drainage, cough. Used to use flonase w/good results All questions were answered.   Physical Assessment:   Vitals:   07/31/18 0955  BP: 110/63  Pulse: 80  Weight: 142 lb (64.4 kg)  Body mass index is 26.4 kg/m.        Physical Examination:   General appearance: Well appearing, and in no distress  Mental status: Alert, oriented to person, place, and time  Skin: Warm & dry  Cardiovascular: Normal heart rate noted  Respiratory: Normal respiratory effort, no distress  Abdomen: Soft, gravid, nontender  Pelvic: Cervical exam deferred         Extremities: Edema: None  Fetal Status:     Movement: Present    Results for orders placed or performed in visit on 07/31/18 (from the past 24 hour(s))  POC Urinalysis Dipstick OB   Collection Time: 07/31/18  9:58 AM  Result Value Ref Range   Color, UA     Clarity, UA     Glucose, UA Negative Negative   Bilirubin, UA     Ketones, UA neg    Spec Grav, UA     Blood, UA neg    pH, UA     POC,PROTEIN,UA Negative Negative, Trace, Small (1+), Moderate (2+), Large (3+), 4+   Urobilinogen, UA     Nitrite, UA neg    Leukocytes, UA Negative Negative   Appearance     Odor       Orders Placed This Encounter  Procedures  . US OB Comp + 14 Wk  . INTEGRATED 2  . POC Urinalysis Dipstick OB    Plan:  Continued routine obstetrical care, try flonase, may need antibiotics.   Return in about 2 weeks (around 08/14/2018) for  LROB, TD:D[place]GURK.

## 2018-07-31 NOTE — Addendum Note (Signed)
Addended by: Jacklyn ShellRESENZO-DISHMON, Dolores Mcgovern on: 07/31/2018 10:19 AM   Modules accepted: Orders

## 2018-08-02 LAB — INTEGRATED 2
ADSF: 1.01
AFP MOM: 1.17
Alpha-Fetoprotein: 48.2 ng/mL
Crown Rump Length: 79.7 mm
DIA MoM: 1.24
DIA Value: 212 pg/mL
Estriol, Unconjugated: 1.28 ng/mL
GEST. AGE ON COLLECTION DATE: 13.7 wk
Gestational Age: 17.7 weeks
HCG MOM: 1.42
HCG VALUE: 38.5 [IU]/mL
MATERNAL AGE AT EDD: 36.2 a
NUCHAL TRANSLUCENCY (NT): 1.8 mm
Nuchal Translucency MoM: 0.96
Number of Fetuses: 1
PAPP-A MoM: 1.29
PAPP-A Value: 2063.6 ng/mL
Test Results:: NEGATIVE
WEIGHT: 140 [lb_av]
WEIGHT: 140 [lb_av]

## 2018-08-07 ENCOUNTER — Telehealth: Payer: Self-pay | Admitting: Advanced Practice Midwife

## 2018-08-07 MED ORDER — AMOXICILLIN-POT CLAVULANATE 875-125 MG PO TABS: 1.0000 | ORAL_TABLET | Freq: Two times a day (BID) | ORAL | 0 refills | Status: DC

## 2018-08-07 NOTE — Telephone Encounter (Signed)
Patient called, stated that she saw Drenda Freeze last week and she told her if she wasn't any better this week to call back and she would call in an antibiotic.  [facility] on [address]  [phone]

## 2018-08-07 NOTE — Telephone Encounter (Signed)
Pt aware med was sent to pharmacy. JSY °

## 2018-08-15 ENCOUNTER — Encounter: Payer: Self-pay | Admitting: Women's Health

## 2018-08-15 ENCOUNTER — Ambulatory Visit (INDEPENDENT_AMBULATORY_CARE_PROVIDER_SITE_OTHER): Payer: Medicaid Other | Admitting: Women's Health

## 2018-08-15 ENCOUNTER — Ambulatory Visit: Payer: Medicaid Other

## 2018-08-15 VITALS — BP 96/63 | HR 82 | Wt 141.4 lb

## 2018-08-15 DIAGNOSIS — Z3482 Encounter for supervision of other normal pregnancy, second trimester: Secondary | ICD-10-CM

## 2018-08-15 DIAGNOSIS — Z3A19 19 weeks gestation of pregnancy: Secondary | ICD-10-CM

## 2018-08-15 NOTE — Patient Instructions (Addendum)
Susan PolioJennifer Salinas, I greatly value your feedback.  If you receive a survey following your visit with us today, we appreciate you taking the time to fill it out.  Thanks, Susan HaffKim Chevie Salinas, CNM, WHNP-BC   Humidifier and saline nasal spray for nasal congestion  Regular robitussin, cough drops for cough  Warm salt water gargles for sore throat  Mucinex with lots of water to help you cough up the mucous in your chest if needed  Drink plenty of fluids and stay hydrated!  Wash your hands frequently.    Second Trimester of Pregnancy The second trimester is from week 14 through week 27 (months 4 through 6). The second trimester is often a time when you feel your best. Your body has adjusted to being pregnant, and you begin to feel better physically. Usually, morning sickness has lessened or quit completely, you may have more energy, and you may have an increase in appetite. The second trimester is also a time when the fetus is growing rapidly. At the end of the sixth month, the fetus is about 9 inches long and weighs about 1 pounds. You will likely begin to feel the baby move (quickening) between 16 and 20 weeks of pregnancy. Body changes during your second trimester Your body continues to go through many changes during your second trimester. The changes vary from woman to woman.  Your weight will continue to increase. You will notice your lower abdomen bulging out.  You may begin to get stretch marks on your hips, abdomen, and breasts.  You may develop headaches that can be relieved by medicines. The medicines should be approved by your health care provider.  You may urinate more often because the fetus is pressing on your bladder.  You may develop or continue to have heartburn as a result of your pregnancy.  You may develop constipation because certain hormones are causing the muscles that push waste through your intestines to slow down.  You may develop hemorrhoids or swollen, bulging veins  (varicose veins).  You may have back pain. This is caused by: ? Weight gain. ? Pregnancy hormones that are relaxing the joints in your pelvis. ? A shift in weight and the muscles that support your balance.  Your breasts will continue to grow and they will continue to become tender.  Your gums may bleed and may be sensitive to brushing and flossing.  Dark spots or blotches (chloasma, mask of pregnancy) may develop on your face. This will likely fade after the baby is born.  A dark line from your belly button to the pubic area (linea nigra) may appear. This will likely fade after the baby is born.  You may have changes in your hair. These can include thickening of your hair, rapid growth, and changes in texture. Some women also have hair loss during or after pregnancy, or hair that feels dry or thin. Your hair will most likely return to normal after your baby is born.  What to expect at prenatal visits During a routine prenatal visit:  You will be weighed to make sure you and the fetus are growing normally.  Your blood pressure will be taken.  Your abdomen will be measured to track your baby's growth.  The fetal heartbeat will be listened to.  Any test results from the previous visit will be discussed.  Your health care provider may ask you:  How you are feeling.  If you are feeling the baby move.  If you have had any abnormal symptoms,  such as leaking fluid, bleeding, severe headaches, or abdominal cramping.  If you are using any tobacco products, including cigarettes, chewing tobacco, and electronic cigarettes.  If you have any questions.  Other tests that may be performed during your second trimester include:  Blood tests that check for: ? Low iron levels (anemia). ? High blood sugar that affects pregnant women (gestational diabetes) between 17 and 28 weeks. ? Rh antibodies. This is to check for a protein on red blood cells (Rh factor).  Urine tests to check for  infections, diabetes, or protein in the urine.  An ultrasound to confirm the proper growth and development of the baby.  An amniocentesis to check for possible genetic problems.  Fetal screens for spina bifida and Down syndrome.  HIV (human immunodeficiency virus) testing. Routine prenatal testing includes screening for HIV, unless you choose not to have this test.  Follow these instructions at home: Medicines  Follow your health care provider's instructions regarding medicine use. Specific medicines may be either safe or unsafe to take during pregnancy.  Take a prenatal vitamin that contains at least 600 micrograms (mcg) of folic acid.  If you develop constipation, try taking a stool softener if your health care provider approves. Eating and drinking  Eat a balanced diet that includes fresh fruits and vegetables, whole grains, good sources of protein such as meat, eggs, or tofu, and low-fat dairy. Your health care provider will help you determine the amount of weight gain that is right for you.  Avoid raw meat and uncooked cheese. These carry germs that can cause birth defects in the baby.  If you have low calcium intake from food, talk to your health care provider about whether you should take a daily calcium supplement.  Limit foods that are high in fat and processed sugars, such as fried and sweet foods.  To prevent constipation: ? Drink enough fluid to keep your urine clear or pale yellow. ? Eat foods that are high in fiber, such as fresh fruits and vegetables, whole grains, and beans. Activity  Exercise only as directed by your health care provider. Most women can continue their usual exercise routine during pregnancy. Try to exercise for 30 minutes at least 5 days a week. Stop exercising if you experience uterine contractions.  Avoid heavy lifting, wear low heel shoes, and practice good posture.  A sexual relationship may be continued unless your health care provider  directs you otherwise. Relieving pain and discomfort  Wear a good support bra to prevent discomfort from breast tenderness.  Take warm sitz baths to soothe any pain or discomfort caused by hemorrhoids. Use hemorrhoid cream if your health care provider approves.  Rest with your legs elevated if you have leg cramps or low back pain.  If you develop varicose veins, wear support hose. Elevate your feet for 15 minutes, 3-4 times a day. Limit salt in your diet. Prenatal Care  Write down your questions. Take them to your prenatal visits.  Keep all your prenatal visits as told by your health care provider. This is important. Safety  Wear your seat belt at all times when driving.  Make a list of emergency phone numbers, including numbers for family, friends, the hospital, and police and fire departments. General instructions  Ask your health care provider for a referral to a local prenatal education class. Begin classes no later than the beginning of month 6 of your pregnancy.  Ask for help if you have counseling or nutritional needs during pregnancy.  Your health care provider can offer advice or refer you to specialists for help with various needs.  Do not use hot tubs, steam rooms, or saunas.  Do not douche or use tampons or scented sanitary pads.  Do not cross your legs for long periods of time.  Avoid cat litter boxes and soil used by cats. These carry germs that can cause birth defects in the baby and possibly loss of the fetus by miscarriage or stillbirth.  Avoid all smoking, herbs, alcohol, and unprescribed drugs. Chemicals in these products can affect the formation and growth of the baby.  Do not use any products that contain nicotine or tobacco, such as cigarettes and e-cigarettes. If you need help quitting, ask your health care provider.  Visit your dentist if you have not gone yet during your pregnancy. Use a soft toothbrush to brush your teeth and be gentle when you  floss. Contact a health care provider if:  You have dizziness.  You have mild pelvic cramps, pelvic pressure, or nagging pain in the abdominal area.  You have persistent nausea, vomiting, or diarrhea.  You have a bad smelling vaginal discharge.  You have pain when you urinate. Get help right away if:  You have a fever.  You are leaking fluid from your vagina.  You have spotting or bleeding from your vagina.  You have severe abdominal cramping or pain.  You have rapid weight gain or weight loss.  You have shortness of breath with chest pain.  You notice sudden or extreme swelling of your face, hands, ankles, feet, or legs.  You have not felt your baby move in over an hour.  You have severe headaches that do not go away when you take medicine.  You have vision changes. Summary  The second trimester is from week 14 through week 27 (months 4 through 6). It is also a time when the fetus is growing rapidly.  Your body goes through many changes during pregnancy. The changes vary from woman to woman.  Avoid all smoking, herbs, alcohol, and unprescribed drugs. These chemicals affect the formation and growth your baby.  Do not use any tobacco products, such as cigarettes, chewing tobacco, and e-cigarettes. If you need help quitting, ask your health care provider.  Contact your health care provider if you have any questions. Keep all prenatal visits as told by your health care provider. This is important. This information is not intended to replace advice given to you by your health care provider. Make sure you discuss any questions you have with your health care provider. Document Released: 06/29/2001 Document Revised: 12/11/2015 Document Reviewed: 09/05/2012 Elsevier Interactive Patient Education  2017 [employer].

## 2018-08-15 NOTE — Progress Notes (Signed)
   LOW-RISK PREGNANCY VISIT Patient name: Caprica Arntz MRN [id number]  Date of birth: [birth year] Chief Complaint:   Routine Prenatal Visit  History of Present Illness:   Fadumo Rahill is a 38 y.o. G48P2002 female at [redacted]w[redacted]d with an Estimated Date of Delivery: 01/06/19 being seen today for ongoing management of a low-risk pregnancy.  Today she reports cold x ~2wks, no fever/chills, no ear pain, just cough and sinus congestion. Had messaged about a week ago and rx'd augmentin-finished all. Contractions: Not present. Vag. Bleeding: None.  Movement: Present. denies leaking of fluid. Review of Systems:   Pertinent items are noted in HPI Denies abnormal vaginal discharge w/ itching/odor/irritation, headaches, visual changes, shortness of breath, chest pain, abdominal pain, severe nausea/vomiting, or problems with urination or bowel movements unless otherwise stated above. Pertinent History Reviewed:  Reviewed past medical,surgical, social, obstetrical and family history.  Reviewed problem list, medications and allergies. Physical Assessment:   Vitals:   08/15/18 1002  BP: 96/63  Pulse: 82  Weight: 141 lb 6.4 oz (64.1 kg)  Body mass index is 26.28 kg/m.        Physical Examination:   General appearance: Well appearing, and in no distress  Mental status: Alert, oriented to person, place, and time  Skin: Warm & dry  Sinuses: no tenderness  Ears: unable to see Rt TM d/t cerumen plug, Lt TM white at bottom- pt states scar from previous tubes  Mouth: non erythematous, no exudate  Throat: no lymphadenopathy  Cardiovascular: Normal heart rate noted, HRRR  Respiratory: Normal respiratory effort, no distress, LCTAB  Abdomen: Soft, gravid, nontender  Pelvic: Cervical exam deferred         Extremities: Edema: None  Fetal Status:     Movement: Present    No results found for this or any previous visit (from the past 24 hour(s)).  Assessment & Plan:  1) Low-risk pregnancy G3P2002 at [redacted]w[redacted]d with  an Estimated Date of Delivery: 01/06/19   2) Resolving URI, cough can last awhile, gave otc relief measures   Meds: No orders of the defined types were placed in this encounter.  Labs/procedures today: unable to do anatomy u/s as scheduled, brought 38yo w/ her and no one to watch him- will reschedule  Plan:  Continue routine obstetrical care   Reviewed: Preterm labor symptoms and general obstetric precautions including but not limited to vaginal bleeding, contractions, leaking of fluid and fetal movement were reviewed in detail with the patient.  All questions were answered  Follow-up: Return for asap anatomy u/s (no visit), then 4wks for LROB.  No orders of the defined types were placed in this encounter.  Cheral Marker CNM, [facility] 08/15/2018 10:32 AM

## 2018-08-22 ENCOUNTER — Ambulatory Visit (INDEPENDENT_AMBULATORY_CARE_PROVIDER_SITE_OTHER): Payer: Medicaid Other

## 2018-08-22 DIAGNOSIS — Z3402 Encounter for supervision of normal first pregnancy, second trimester: Secondary | ICD-10-CM

## 2018-08-22 DIAGNOSIS — Z363 Encounter for antenatal screening for malformations: Secondary | ICD-10-CM

## 2018-08-22 NOTE — Progress Notes (Signed)
[place] 20+3 wks,cephalic,cx 5.3 cm,anterior placenta gr 0,normal left ovary,right ovary not visualized ,svp of fluid 5.7 cm,efw 419 g 89%,anatomy complete,no obvious abnormalities

## 2018-09-12 ENCOUNTER — Encounter: Payer: Self-pay | Admitting: Obstetrics & Gynecology

## 2018-09-12 ENCOUNTER — Other Ambulatory Visit: Payer: Self-pay

## 2018-09-12 ENCOUNTER — Ambulatory Visit (INDEPENDENT_AMBULATORY_CARE_PROVIDER_SITE_OTHER): Payer: Medicaid Other | Admitting: Obstetrics & Gynecology

## 2018-09-12 VITALS — BP 104/62 | HR 83 | Wt 145.0 lb

## 2018-09-12 DIAGNOSIS — Z3A23 23 weeks gestation of pregnancy: Secondary | ICD-10-CM

## 2018-09-12 DIAGNOSIS — Z3482 Encounter for supervision of other normal pregnancy, second trimester: Secondary | ICD-10-CM

## 2018-09-12 DIAGNOSIS — Z1389 Encounter for screening for other disorder: Secondary | ICD-10-CM

## 2018-09-12 DIAGNOSIS — Z331 Pregnant state, incidental: Secondary | ICD-10-CM

## 2018-09-12 LAB — POCT URINALYSIS DIPSTICK OB
Glucose, UA: NEGATIVE
Ketones, UA: NEGATIVE
Leukocytes, UA: NEGATIVE
Nitrite, UA: NEGATIVE
RBC UA: NEGATIVE

## 2018-09-12 NOTE — Progress Notes (Signed)
   LOW-RISK PREGNANCY VISIT Patient name: Susan Salinas MRN [id number]  Date of birth: 1981-06-11 Chief Complaint:   Routine Prenatal Visit  History of Present Illness:   Susan Salinas is a 38 y.o. G32P2002 female at [redacted]w[redacted]d with an Estimated Date of Delivery: 01/06/19 being seen today for ongoing management of a low-risk pregnancy.  Today she reports no complaints. Contractions: Irritability. Vag. Bleeding: None.  Movement: Present. denies leaking of fluid. Review of Systems:   Pertinent items are noted in HPI Denies abnormal vaginal discharge w/ itching/odor/irritation, headaches, visual changes, shortness of breath, chest pain, abdominal pain, severe nausea/vomiting, or problems with urination or bowel movements unless otherwise stated above. Pertinent History Reviewed:  Reviewed past medical,surgical, social, obstetrical and family history.  Reviewed problem list, medications and allergies. Physical Assessment:   Vitals:   09/12/18 1012  BP: 104/62  Pulse: 83  Weight: 145 lb (65.8 kg)  Body mass index is 26.95 kg/m.        Physical Examination:   General appearance: Well appearing, and in no distress  Mental status: Alert, oriented to person, place, and time  Skin: Warm & dry  Cardiovascular: Normal heart rate noted  Respiratory: Normal respiratory effort, no distress  Abdomen: Soft, gravid, nontender  Pelvic: Cervical exam deferred         Extremities: Edema: None  Fetal Status: Fetal Heart Rate (bpm): 155 Fundal Height: 25 cm Movement: Present    Results for orders placed or performed in visit on 09/12/18 (from the past 24 hour(s))  POC Urinalysis Dipstick OB   Collection Time: 09/12/18 10:12 AM  Result Value Ref Range   Color, UA     Clarity, UA     Glucose, UA Negative Negative   Bilirubin, UA     Ketones, UA neg    Spec Grav, UA     Blood, UA neg    pH, UA     POC,PROTEIN,UA Trace Negative, Trace, Small (1+), Moderate (2+), Large (3+), 4+   Urobilinogen, UA       Nitrite, UA neg    Leukocytes, UA Negative Negative   Appearance     Odor      Assessment & Plan:  1) Low-risk pregnancy G3P2002 at [redacted]w[redacted]d with an Estimated Date of Delivery: 01/06/19   2) O negative, RhoGam after PN2   Meds: No orders of the defined types were placed in this encounter.  Labs/procedures today:   Plan:  Continue routine obstetrical care PN2 next visit  Reviewed: Preterm labor symptoms and general obstetric precautions including but not limited to vaginal bleeding, contractions, leaking of fluid and fetal movement were reviewed in detail with the patient.  All questions were answered  Follow-up: Return in about 4 weeks (around 10/10/2018) for PN2, LROB.  Orders Placed This Encounter  Procedures  . POC Urinalysis Dipstick OB   Lazaro Arms  09/12/2018 10:28 AM

## 2018-10-09 ENCOUNTER — Telehealth: Payer: Self-pay | Admitting: *Deleted

## 2018-10-09 NOTE — Telephone Encounter (Signed)
Called patient and informed her of COVID 19 restrictions. Patient reports no symptoms at this time or contact with persons infected or suspected of infection.  

## 2018-10-10 ENCOUNTER — Other Ambulatory Visit: Payer: Self-pay

## 2018-10-10 ENCOUNTER — Encounter: Payer: Self-pay | Admitting: Women's Health

## 2018-10-10 ENCOUNTER — Other Ambulatory Visit: Payer: Medicaid Other

## 2018-10-10 ENCOUNTER — Ambulatory Visit (INDEPENDENT_AMBULATORY_CARE_PROVIDER_SITE_OTHER): Payer: Medicaid Other | Admitting: Women's Health

## 2018-10-10 VITALS — BP 107/65 | HR 83 | Wt 148.5 lb

## 2018-10-10 DIAGNOSIS — Z3A27 27 weeks gestation of pregnancy: Secondary | ICD-10-CM

## 2018-10-10 DIAGNOSIS — Z3482 Encounter for supervision of other normal pregnancy, second trimester: Secondary | ICD-10-CM

## 2018-10-10 DIAGNOSIS — Z331 Pregnant state, incidental: Secondary | ICD-10-CM

## 2018-10-10 DIAGNOSIS — Z1389 Encounter for screening for other disorder: Secondary | ICD-10-CM

## 2018-10-10 LAB — POCT URINALYSIS DIPSTICK OB
Blood, UA: NEGATIVE
Glucose, UA: NEGATIVE
Ketones, UA: NEGATIVE
LEUKOCYTES UA: NEGATIVE
NITRITE UA: NEGATIVE

## 2018-10-10 NOTE — Progress Notes (Signed)
   LOW-RISK PREGNANCY VISIT Patient name: Susan Salinas MRN [id number]  Date of birth: 07-Dec-1980 Chief Complaint:   Routine Prenatal Visit (PN2 today)  History of Present Illness:   Susan Salinas is a 38 y.o. G68P2002 female at [redacted]w[redacted]d with an Estimated Date of Delivery: 01/06/19 being seen today for ongoing management of a low-risk pregnancy.  Today she reports no complaints. Contractions: Not present. Vag. Bleeding: None.  Movement: Present. denies leaking of fluid. Review of Systems:   Pertinent items are noted in HPI Denies abnormal vaginal discharge w/ itching/odor/irritation, headaches, visual changes, shortness of breath, chest pain, abdominal pain, severe nausea/vomiting, or problems with urination or bowel movements unless otherwise stated above. Pertinent History Reviewed:  Reviewed past medical,surgical, social, obstetrical and family history.  Reviewed problem list, medications and allergies. Physical Assessment:   Vitals:   10/10/18 0900  BP: 107/65  Pulse: 83  Weight: 148 lb 8 oz (67.4 kg)  Body mass index is 27.6 kg/m.        Physical Examination:   General appearance: Well appearing, and in no distress  Mental status: Alert, oriented to person, place, and time  Skin: Warm & dry  Cardiovascular: Normal heart rate noted  Respiratory: Normal respiratory effort, no distress  Abdomen: Soft, gravid, nontender  Pelvic: Cervical exam deferred         Extremities: Edema: None  Fetal Status: Fetal Heart Rate (bpm): 155 Fundal Height: 28 cm Movement: Present    Results for orders placed or performed in visit on 10/10/18 (from the past 24 hour(s))  POC Urinalysis Dipstick OB   Collection Time: 10/10/18  9:01 AM  Result Value Ref Range   Color, UA     Clarity, UA     Glucose, UA Negative Negative   Bilirubin, UA     Ketones, UA neg    Spec Grav, UA     Blood, UA neg    pH, UA     POC,PROTEIN,UA Trace Negative, Trace, Small (1+), Moderate (2+), Large (3+), 4+   Urobilinogen, UA     Nitrite, UA neg    Leukocytes, UA Negative Negative   Appearance     Odor      Assessment & Plan:  1) Low-risk pregnancy G3P2002 at [redacted]w[redacted]d with an Estimated Date of Delivery: 01/06/19    Meds: No orders of the defined types were placed in this encounter.  Labs/procedures today: pn2, wants tdap next visit  Plan:  Continue routine obstetrical care   Reviewed: Preterm labor symptoms and general obstetric precautions including but not limited to vaginal bleeding, contractions, leaking of fluid and fetal movement were reviewed in detail with the patient.  All questions were answered  Follow-up: Return in about 4 weeks (around 11/07/2018) for LROB, rhogam, and tdap.  Orders Placed This Encounter  Procedures  . POC Urinalysis Dipstick OB   Cheral Marker CNM, [facility] 10/10/2018 9:30 AM

## 2018-10-10 NOTE — Patient Instructions (Signed)
Ronna Polio, I greatly value your feedback.  If you receive a survey following your visit with [place] today, we appreciate you taking the time to fill it out.  Thanks, [name], CNM, [facility]  [facility] HAS MOVED!!! It is now [facility] ([address] [place], [place] [address]) Entrance located off of [address] Free 24/7 valet parking    Call the office [phone]) or go to [facility] if:  You begin to have strong, frequent contractions  Your water breaks.  Sometimes it is a big gush of fluid, sometimes it is just a trickle that keeps getting your panties wet or running down your legs  You have vaginal bleeding.  It is normal to have a small amount of spotting if your cervix was checked.   You don't feel your baby moving like normal.  If you don't, get you something to eat and drink and lay down and focus on feeling your baby move.  You should feel at least 10 movements in 2 hours.  If you don't, you should call the office or go to [facility].    Tdap Vaccine  It is recommended that you get the Tdap vaccine during the third trimester of EACH pregnancy to help protect your baby from getting pertussis (whooping cough)  27-36 weeks is the BEST time to do this so that you can pass the protection on to your baby. During pregnancy is better than after pregnancy, but if you are unable to get it during pregnancy it will be offered at the hospital.   You can get this vaccine with [place], at the health department, your family doctor, or some local pharmacies  Everyone who will be around your baby should also be up-to-date on their vaccines before the baby comes. Adults (who are not pregnant) only need 1 dose of Tdap during adulthood.   Third Trimester of Pregnancy The third trimester is from week 29 through week 42, months 7 through 9. The third trimester is a time when the fetus is growing rapidly. At the end of the ninth month, the fetus is  about 20 inches in length and weighs 6-10 pounds.  BODY CHANGES Your body goes through many changes during pregnancy. The changes vary from woman to woman.   Your weight will continue to increase. You can expect to gain 25-35 pounds (11-16 kg) by the end of the pregnancy.  You may begin to get stretch marks on your hips, abdomen, and breasts.  You may urinate more often because the fetus is moving lower into your pelvis and pressing on your bladder.  You may develop or continue to have heartburn as a result of your pregnancy.  You may develop constipation because certain hormones are causing the muscles that push waste through your intestines to slow down.  You may develop hemorrhoids or swollen, bulging veins (varicose veins).  You may have pelvic pain because of the weight gain and pregnancy hormones relaxing your joints between the bones in your pelvis. Backaches may result from overexertion of the muscles supporting your posture.  You may have changes in your hair. These can include thickening of your hair, rapid growth, and changes in texture. Some women also have hair loss during or after pregnancy, or hair that feels dry or thin. Your hair will most likely return to normal after your baby is born.  Your breasts will continue to grow and be tender. A yellow discharge may leak from  your breasts called colostrum.  Your belly button may stick out.  You may feel short of breath because of your expanding uterus.  You may notice the fetus "dropping," or moving lower in your abdomen.  You may have a bloody mucus discharge. This usually occurs a few days to a week before labor begins.  Your cervix becomes thin and soft (effaced) near your due date. WHAT TO EXPECT AT YOUR PRENATAL EXAMS  You will have prenatal exams every 2 weeks until week 36. Then, you will have weekly prenatal exams. During a routine prenatal visit:  You will be weighed to make sure you and the fetus are growing  normally.  Your blood pressure is taken.  Your abdomen will be measured to track your baby's growth.  The fetal heartbeat will be listened to.  Any test results from the previous visit will be discussed.  You may have a cervical check near your due date to see if you have effaced. At around 36 weeks, your caregiver will check your cervix. At the same time, your caregiver will also perform a test on the secretions of the vaginal tissue. This test is to determine if a type of bacteria, Group B streptococcus, is present. Your caregiver will explain this further. Your caregiver may ask you:  What your birth plan is.  How you are feeling.  If you are feeling the baby move.  If you have had any abnormal symptoms, such as leaking fluid, bleeding, severe headaches, or abdominal cramping.  If you have any questions. Other tests or screenings that may be performed during your third trimester include:  Blood tests that check for low iron levels (anemia).  Fetal testing to check the health, activity level, and growth of the fetus. Testing is done if you have certain medical conditions or if there are problems during the pregnancy. FALSE LABOR You may feel small, irregular contractions that eventually go away. These are called Braxton Hicks contractions, or false labor. Contractions may last for hours, days, or even weeks before true labor sets in. If contractions come at regular intervals, intensify, or become painful, it is best to be seen by your caregiver.  SIGNS OF LABOR   Menstrual-like cramps.  Contractions that are 5 minutes apart or less.  Contractions that start on the top of the uterus and spread down to the lower abdomen and back.  A sense of increased pelvic pressure or back pain.  A watery or bloody mucus discharge that comes from the vagina. If you have any of these signs before the 37th week of pregnancy, call your caregiver right away. You need to go to the hospital to  get checked immediately. HOME CARE INSTRUCTIONS   Avoid all smoking, herbs, alcohol, and unprescribed drugs. These chemicals affect the formation and growth of the baby.  Follow your caregiver's instructions regarding medicine use. There are medicines that are either safe or unsafe to take during pregnancy.  Exercise only as directed by your caregiver. Experiencing uterine cramps is a good sign to stop exercising.  Continue to eat regular, healthy meals.  Wear a good support bra for breast tenderness.  Do not use hot tubs, steam rooms, or saunas.  Wear your seat belt at all times when driving.  Avoid raw meat, uncooked cheese, cat litter boxes, and soil used by cats. These carry germs that can cause birth defects in the baby.  Take your prenatal vitamins.  Try taking a stool softener (if your caregiver approves) if  you develop constipation. Eat more high-fiber foods, such as fresh vegetables or fruit and whole grains. Drink plenty of fluids to keep your urine clear or pale yellow.  Take warm sitz baths to soothe any pain or discomfort caused by hemorrhoids. Use hemorrhoid cream if your caregiver approves.  If you develop varicose veins, wear support hose. Elevate your feet for 15 minutes, 3-4 times a day. Limit salt in your diet.  Avoid heavy lifting, wear low heal shoes, and practice good posture.  Rest a lot with your legs elevated if you have leg cramps or low back pain.  Visit your dentist if you have not gone during your pregnancy. Use a soft toothbrush to brush your teeth and be gentle when you floss.  A sexual relationship may be continued unless your caregiver directs you otherwise.  Do not travel far distances unless it is absolutely necessary and only with the approval of your caregiver.  Take prenatal classes to understand, practice, and ask questions about the labor and delivery.  Make a trial run to the hospital.  Pack your hospital bag.  Prepare the baby's  nursery.  Continue to go to all your prenatal visits as directed by your caregiver. SEEK MEDICAL CARE IF:  You are unsure if you are in labor or if your water has broken.  You have dizziness.  You have mild pelvic cramps, pelvic pressure, or nagging pain in your abdominal area.  You have persistent nausea, vomiting, or diarrhea.  You have a bad smelling vaginal discharge.  You have pain with urination. SEEK IMMEDIATE MEDICAL CARE IF:   You have a fever.  You are leaking fluid from your vagina.  You have spotting or bleeding from your vagina.  You have severe abdominal cramping or pain.  You have rapid weight loss or gain.  You have shortness of breath with chest pain.  You notice sudden or extreme swelling of your face, hands, ankles, feet, or legs.  You have not felt your baby move in over an hour.  You have severe headaches that do not go away with medicine.  You have vision changes. Document Released: 06/29/2001 Document Revised: 07/10/2013 Document Reviewed: 09/05/2012 [facility] Patient Information 2015 [place], [place]. This information is not intended to replace advice given to you by your health care provider. Make sure you discuss any questions you have with your health care provider.  Coronavirus (COVID-19) Are you at risk?  Are you at risk for the Coronavirus (COVID-19)?  To be considered HIGH RISK for Coronavirus (COVID-19), you have to meet the following criteria:  . Traveled to [place], [place], [place], [place] or [place]; or in the [place] to [place], [place], [place], or [place]; and have fever, cough, and shortness of breath within the last 2 weeks of travel OR . Been in close contact with a person diagnosed with COVID-19 within the last 2 weeks and have fever, cough, and shortness of breath . IF YOU DO NOT MEET THESE CRITERIA, YOU ARE CONSIDERED LOW RISK FOR COVID-19.  What to do if you are HIGH RISK for COVID-19?  Marland Kitchen If you are having a  medical emergency, call 911. . Seek medical care right away. Before you go to a doctor's office, urgent care or emergency department, call ahead and tell them about your recent travel, contact with someone diagnosed with COVID-19, and your symptoms. You should receive instructions from your physician's office regarding next steps of care.  . When you arrive at healthcare  provider, tell the healthcare staff immediately you have returned from visiting [place], [place], [place], [place] or [place]; or traveled in the [place] to [place], [place], [place], or [place]; in the last two weeks or you have been in close contact with a person diagnosed with COVID-19 in the last 2 weeks.   . Tell the health care staff about your symptoms: fever, cough and shortness of breath. . After you have been seen by a medical provider, you will be either: o Tested for (COVID-19) and discharged home on quarantine except to seek medical care if symptoms worsen, and asked to  - Stay home and avoid contact with others until you get your results (4-5 days)  - Avoid travel on public transportation if possible (such as bus, train, or airplane) or o Sent to the Emergency Department by EMS for evaluation, COVID-19 testing, and possible admission depending on your condition and test results.  What to do if you are LOW RISK for COVID-19?  Reduce your risk of any infection by using the same precautions used for avoiding the common cold or flu:  Marland Kitchen Wash your hands often with soap and warm water for at least 20 seconds.  If soap and water are not readily available, use an alcohol-based hand sanitizer with at least 60% alcohol.  . If coughing or sneezing, cover your mouth and nose by coughing or sneezing into the elbow areas of your shirt or coat, into a tissue or into your sleeve (not your hands). . Avoid shaking hands with others and consider head nods or verbal greetings only. . Avoid touching your eyes, nose, or mouth with  unwashed hands.  . Avoid close contact with people who are sick. . Avoid places or events with large numbers of people in one location, like concerts or sporting events. . Carefully consider travel plans you have or are making. . If you are planning any travel outside or inside the [place], visit the CDC's Travelers' Health webpage for the latest health notices. . If you have some symptoms but not all symptoms, continue to monitor at home and seek medical attention if your symptoms worsen. . If you are having a medical emergency, call 911.   ADDITIONAL HEALTHCARE OPTIONS FOR PATIENTS  [facility] / e-Visit: [email or web address]         [facility]: [phone]  [facility]: [phone]                   [facility]: [phone]

## 2018-10-11 LAB — CBC
Hematocrit: 32.9 % — ABNORMAL LOW (ref 34.0–46.6)
Hemoglobin: 11 g/dL — ABNORMAL LOW (ref 11.1–15.9)
MCH: 30.3 pg (ref 26.6–33.0)
MCHC: 33.4 g/dL (ref 31.5–35.7)
MCV: 91 fL (ref 79–97)
Platelets: 201 10*3/uL (ref 150–450)
RBC: 3.63 x10E6/uL — AB (ref 3.77–5.28)
RDW: 12.7 % (ref 11.7–15.4)
WBC: 9.4 10*3/uL (ref 3.4–10.8)

## 2018-10-11 LAB — GLUCOSE TOLERANCE, 2 HOURS W/ 1HR
Glucose, 1 hour: 146 mg/dL (ref 65–179)
Glucose, 2 hour: 117 mg/dL (ref 65–152)
Glucose, Fasting: 72 mg/dL (ref 65–91)

## 2018-10-11 LAB — ANTIBODY SCREEN: Antibody Screen: NEGATIVE

## 2018-10-11 LAB — RPR: RPR Ser Ql: NONREACTIVE

## 2018-10-11 LAB — HIV ANTIBODY (ROUTINE TESTING W REFLEX): HIV SCREEN 4TH GENERATION: NONREACTIVE

## 2018-10-25 ENCOUNTER — Telehealth: Payer: Medicaid Other | Admitting: Family

## 2018-10-25 DIAGNOSIS — B372 Candidiasis of skin and nail: Secondary | ICD-10-CM

## 2018-10-25 MED ORDER — NYSTATIN 100000 UNIT/GM EX POWD: Freq: Four times a day (QID) | CUTANEOUS | 0 refills | Status: DC

## 2018-10-25 MED ORDER — NYSTATIN 100000 UNIT/GM EX CREA: 1.0000 "application " | TOPICAL_CREAM | Freq: Two times a day (BID) | CUTANEOUS | 0 refills | Status: DC

## 2018-10-25 NOTE — Progress Notes (Signed)
E Visit for Rash  We are sorry that you are not feeling well. Here is how we plan to help!    Based upon your presentation it appears you have a fungal infection.  I have prescribed: and Nystatin cream apply to the affected area twice daily and have also sent in nystatin powder you can apply four times a day as needed.   Approximately 5 minutes was spent documenting and reviewing patient's chart.    HOME CARE:   Take cool showers and avoid direct sunlight.  Apply cool compress or wet dressings.  Take a bath in an oatmeal bath.  Sprinkle content of one Aveeno packet under running faucet with comfortably warm water.  Bathe for 15-20 minutes, 1-2 times daily.  Pat dry with a towel. Do not rub the rash.  Use hydrocortisone cream.  Take an antihistamine like Benadryl for widespread rashes that itch.  The adult dose of Benadryl is 25-50 mg by mouth 4 times daily.  Caution:  This type of medication may cause sleepiness.  Do not drink alcohol, drive, or operate dangerous machinery while taking antihistamines.  Do not take these medications if you have prostate enlargement.  Read package instructions thoroughly on all medications that you take.  GET HELP RIGHT AWAY IF:   Symptoms don't go away after treatment.  Severe itching that persists.  If you rash spreads or swells.  If you rash begins to smell.  If it blisters and opens or develops a yellow-brown crust.  You develop a fever.  You have a sore throat.  You become short of breath.  MAKE SURE YOU:  Understand these instructions. Will watch your condition. Will get help right away if you are not doing well or get worse.  Thank you for choosing an e-visit. Your e-visit answers were reviewed by a board certified advanced clinical practitioner to complete your personal care plan. Depending upon the condition, your plan could have included both over the counter or prescription medications. Please review your pharmacy choice.  Be sure that the pharmacy you have chosen is open so that you can pick up your prescription now.  If there is a problem you may message your provider in MyChart to have the prescription routed to another pharmacy. Your safety is important to [place]. If you have drug allergies check your prescription carefully.  For the next 24 hours, you can use MyChart to ask questions about today's visit, request a non-urgent call back, or ask for a work or school excuse from your e-visit provider. You will get an email in the next two days asking about your experience. I hope that your e-visit has been valuable and will speed your recovery.

## 2018-11-03 ENCOUNTER — Encounter: Payer: Self-pay | Admitting: *Deleted

## 2018-11-07 ENCOUNTER — Other Ambulatory Visit: Payer: Self-pay

## 2018-11-07 ENCOUNTER — Ambulatory Visit (INDEPENDENT_AMBULATORY_CARE_PROVIDER_SITE_OTHER): Payer: Medicaid Other | Admitting: Women's Health

## 2018-11-07 ENCOUNTER — Encounter: Payer: Self-pay | Admitting: Women's Health

## 2018-11-07 VITALS — BP 105/70 | HR 86 | Temp 97.9°F | Wt 150.0 lb

## 2018-11-07 DIAGNOSIS — Z23 Encounter for immunization: Secondary | ICD-10-CM

## 2018-11-07 DIAGNOSIS — O360930 Maternal care for other rhesus isoimmunization, third trimester, not applicable or unspecified: Secondary | ICD-10-CM

## 2018-11-07 DIAGNOSIS — Z3483 Encounter for supervision of other normal pregnancy, third trimester: Secondary | ICD-10-CM

## 2018-11-07 DIAGNOSIS — Z6791 Unspecified blood type, Rh negative: Secondary | ICD-10-CM

## 2018-11-07 DIAGNOSIS — O360130 Maternal care for anti-D [Rh] antibodies, third trimester, not applicable or unspecified: Secondary | ICD-10-CM | POA: Diagnosis not present

## 2018-11-07 DIAGNOSIS — Z3A31 31 weeks gestation of pregnancy: Secondary | ICD-10-CM | POA: Diagnosis not present

## 2018-11-07 DIAGNOSIS — O26893 Other specified pregnancy related conditions, third trimester: Secondary | ICD-10-CM

## 2018-11-07 DIAGNOSIS — Z1389 Encounter for screening for other disorder: Secondary | ICD-10-CM

## 2018-11-07 DIAGNOSIS — O26899 Other specified pregnancy related conditions, unspecified trimester: Secondary | ICD-10-CM

## 2018-11-07 DIAGNOSIS — Z331 Pregnant state, incidental: Secondary | ICD-10-CM

## 2018-11-07 LAB — POCT URINALYSIS DIPSTICK OB
Blood, UA: NEGATIVE
Glucose, UA: NEGATIVE
Ketones, UA: NEGATIVE
Nitrite, UA: NEGATIVE
POC,PROTEIN,UA: NEGATIVE

## 2018-11-07 MED ORDER — BLOOD PRESSURE MONITOR MISC: 0 refills | Status: DC

## 2018-11-07 NOTE — Patient Instructions (Signed)
Susan Salinas, I greatly value your feedback.  If you receive a survey following your visit with [place] today, we appreciate you taking the time to fill it out.  Thanks, [name], CNM, [facility]  [facility] HAS MOVED!!! It is now [facility] at [facility] ([address] [place], [place] [address]) Entrance located off of [address] Free 24/7 valet parking    Call the office [phone]) or go to [facility] if:  You begin to have strong, frequent contractions  Your water breaks.  Sometimes it is a big gush of fluid, sometimes it is just a trickle that keeps getting your panties wet or running down your legs  You have vaginal bleeding.  It is normal to have a small amount of spotting if your cervix was checked.   You don't feel your baby moving like normal.  If you don't, get you something to eat and drink and lay down and focus on feeling your baby move.  You should feel at least 10 movements in 2 hours.  If you don't, you should call the office or go to [facility].   Constipation  Drink plenty of fluid, preferably water, throughout the day  Eat foods high in fiber such as fruits, vegetables, and grains  Exercise, such as walking, is a good way to keep your bowels regular  Drink warm fluids, especially warm prune juice, or decaf coffee  Eat a 1/2 cup of real oatmeal (not instant), 1/2 cup applesauce, and 1/2-1 cup warm prune juice every day  If needed, you may take Colace (docusate sodium) stool softener once or twice a day to help keep the stool soft. If you are pregnant, wait until you are out of your first trimester (12-14 weeks of pregnancy)  If you still are having problems with constipation, you may take Miralax once daily as needed to help keep your bowels regular.  If you are pregnant, wait until you are out of your first trimester (12-14 weeks of pregnancy)   Home Blood Pressure Monitoring for Patients   Your provider has recommended that  you check your blood pressure (BP) at least once a week at home. If you do not have a blood pressure cuff at home, one will be provided for you. Contact your provider if you have not received your monitor within 1 week.   Helpful Tips for Accurate Home Blood Pressure Checks  . Don't smoke, exercise, or drink caffeine 30 minutes before checking your BP . Use the restroom before checking your BP (a full bladder can raise your pressure) . Relax in a comfortable upright chair . Feet on the ground . Left arm resting comfortably on a flat surface at the level of your heart . Legs uncrossed . Back supported . Sit quietly and don't talk . Place the cuff on your bare arm . Adjust snuggly, so that only two fingertips can fit between your skin and the top of the cuff . Check 2 readings separated by at least one minute . Keep a log of your BP readings . For a visual, please reference this diagram: http://ccnc.care/bpdiagram  Provider Name: [facility]     Phone: [phone]  Zone 1: ALL CLEAR  Continue to monitor your symptoms:  . BP reading is less than 140 (top number) or less than 90 (bottom number)  . No right upper stomach pain . No headaches or seeing spots . No feeling nauseated or throwing up . No swelling  in face and hands  Zone 2: CAUTION Call your doctor's office for any of the following:  . BP reading is greater than 140 (top number) or greater than 90 (bottom number)  . Stomach pain under your ribs in the middle or right side . Headaches or seeing spots . Feeling nauseated or throwing up . Swelling in face and hands  Zone 3: EMERGENCY  Seek immediate medical care if you have any of the following:  . BP reading is greater than160 (top number) or greater than 110 (bottom number) . Severe headaches not improving with Tylenol . Serious difficulty catching your breath . Any worsening symptoms from Zone 2    Preterm Labor and Birth Information  The normal length of a  pregnancy is 39-41 weeks. Preterm labor is when labor starts before 37 completed weeks of pregnancy. What are the risk factors for preterm labor? Preterm labor is more likely to occur in women who:  Have certain infections during pregnancy such as a bladder infection, sexually transmitted infection, or infection inside the uterus (chorioamnionitis).  Have a shorter-than-normal cervix.  Have gone into preterm labor before.  Have had surgery on their cervix.  Are younger than age 16 or older than age 54.  Are African American.  Are pregnant with twins or multiple babies (multiple gestation).  Take street drugs or smoke while pregnant.  Do not gain enough weight while pregnant.  Became pregnant shortly after having been pregnant. What are the symptoms of preterm labor? Symptoms of preterm labor include:  Cramps similar to those that can happen during a menstrual period. The cramps may happen with diarrhea.  Pain in the abdomen or lower back.  Regular uterine contractions that may feel like tightening of the abdomen.  A feeling of increased pressure in the pelvis.  Increased watery or bloody mucus discharge from the vagina.  Water breaking (ruptured amniotic sac). Why is it important to recognize signs of preterm labor? It is important to recognize signs of preterm labor because babies who are born prematurely may not be fully developed. This can put them at an increased risk for:  Long-term (chronic) heart and lung problems.  Difficulty immediately after birth with regulating body systems, including blood sugar, body temperature, heart rate, and breathing rate.  Bleeding in the brain.  Cerebral palsy.  Learning difficulties.  Death. These risks are highest for babies who are born before 34 weeks of pregnancy. How is preterm labor treated? Treatment depends on the length of your pregnancy, your condition, and the health of your baby. It may involve:  Having a stitch  (suture) placed in your cervix to prevent your cervix from opening too early (cerclage).  Taking or being given medicines, such as: ? Hormone medicines. These may be given early in pregnancy to help support the pregnancy. ? Medicine to stop contractions. ? Medicines to help mature the baby's lungs. These may be prescribed if the risk of delivery is high. ? Medicines to prevent your baby from developing cerebral palsy. If the labor happens before 34 weeks of pregnancy, you may need to stay in the hospital. What should I do if I think I am in preterm labor? If you think that you are going into preterm labor, call your health care provider right away. How can I prevent preterm labor in future pregnancies? To increase your chance of having a full-term pregnancy:  Do not use any tobacco products, such as cigarettes, chewing tobacco, and e-cigarettes. If you need help  quitting, ask your health care provider.  Do not use street drugs or medicines that have not been prescribed to you during your pregnancy.  Talk with your health care provider before taking any herbal supplements, even if you have been taking them regularly.  Make sure you gain a healthy amount of weight during your pregnancy.  Watch for infection. If you think that you might have an infection, get it checked right away.  Make sure to tell your health care provider if you have gone into preterm labor before. This information is not intended to replace advice given to you by your health care provider. Make sure you discuss any questions you have with your health care provider. Document Released: 09/25/2003 Document Revised: 12/16/2015 Document Reviewed: 11/26/2015 Elsevier Interactive Patient Education  2019 [employer].  Coronavirus (COVID-19) Are you at risk?  Are you at risk for the Coronavirus (COVID-19)?  To be considered HIGH RISK for Coronavirus (COVID-19), you have to meet the following criteria:  . Traveled to  [place], [place], [place], [place] or [place]; or in the [place] to [place], [place], [place], or [place]; and have fever, cough, and shortness of breath within the last 2 weeks of travel OR . Been in close contact with a person diagnosed with COVID-19 within the last 2 weeks and have fever, cough, and shortness of breath . IF YOU DO NOT MEET THESE CRITERIA, YOU ARE CONSIDERED LOW RISK FOR COVID-19.  What to do if you are HIGH RISK for COVID-19?  Marland Kitchen If you are having a medical emergency, call 911. . Seek medical care right away. Before you go to a doctor's office, urgent care or emergency department, call ahead and tell them about your recent travel, contact with someone diagnosed with COVID-19, and your symptoms. You should receive instructions from your physician's office regarding next steps of care.  . When you arrive at healthcare provider, tell the healthcare staff immediately you have returned from visiting [place], [place], [place], [place] or [place]; or traveled in the [place] to [place], [place], [place], or [place]; in the last two weeks or you have been in close contact with a person diagnosed with COVID-19 in the last 2 weeks.   . Tell the health care staff about your symptoms: fever, cough and shortness of breath. . After you have been seen by a medical provider, you will be either: o Tested for (COVID-19) and discharged home on quarantine except to seek medical care if symptoms worsen, and asked to  - Stay home and avoid contact with others until you get your results (4-5 days)  - Avoid travel on public transportation if possible (such as bus, train, or airplane) or o Sent to the Emergency Department by EMS for evaluation, COVID-19 testing, and possible admission depending on your condition and test results.  What to do if you are LOW RISK for COVID-19?  Reduce your risk of any infection by using the same precautions used for avoiding the common cold or flu:   Marland Kitchen Wash your hands often with soap and warm water for at least 20 seconds.  If soap and water are not readily available, use an alcohol-based hand sanitizer with at least 60% alcohol.  . If coughing or sneezing, cover your mouth and nose by coughing or sneezing into the elbow areas of your shirt or coat, into a tissue or into your sleeve (not your hands). . Avoid shaking hands with others and consider head  nods or verbal greetings only. . Avoid touching your eyes, nose, or mouth with unwashed hands.  . Avoid close contact with people who are sick. . Avoid places or events with large numbers of people in one location, like concerts or sporting events. . Carefully consider travel plans you have or are making. . If you are planning any travel outside or inside the [place], visit the CDC's Travelers' Health webpage for the latest health notices. . If you have some symptoms but not all symptoms, continue to monitor at home and seek medical attention if your symptoms worsen. . If you are having a medical emergency, call 911.   ADDITIONAL HEALTHCARE OPTIONS FOR PATIENTS  [facility] / e-Visit: [email or web address]         [facility]: [phone]  [facility]: [phone]                   [facility]: [phone]

## 2018-11-07 NOTE — Progress Notes (Signed)
TELEHEALTH VIRTUAL OBSTETRICS VISIT ENCOUNTER NOTE Patient name: Susan Salinas MRN [id number]  Date of birth: 12-Jan-1981  I connected with patient on 11/07/18 at 11:00 AM EDT by [facility] and verified that I am speaking with the correct person using two identifiers. Due to COVID-19 recommendations, pt is not currently in our office. She was in office earlier for tdap and rhogam.   I discussed the limitations, risks, security and privacy concerns of performing an evaluation and management service by telephone and the availability of in person appointments. I also discussed with the patient that there may be a patient responsible charge related to this service. The patient expressed understanding and agreed to proceed.  Chief Complaint:   Routine Prenatal Visit (Rhogam and Tdap)  History of Present Illness:   Nylan Oestreich is a 38 y.o. G37P2002 female at [redacted]w[redacted]d with an Estimated Date of Delivery: 01/06/19 being evaluated today for ongoing management of a low-risk pregnancy.  Today she reports constipation. Contractions: Irregular. Vag. Bleeding: None.  Movement: Present. denies leaking of fluid. Review of Systems:   Pertinent items are noted in HPI Denies abnormal vaginal discharge w/ itching/odor/irritation, headaches, visual changes, shortness of breath, chest pain, abdominal pain, severe nausea/vomiting, or problems with urination or bowel movements unless otherwise stated above. Pertinent History Reviewed:  Reviewed past medical,surgical, social, obstetrical and family history.  Reviewed problem list, medications and allergies. Physical Assessment:   Vitals:   11/07/18 1059  BP: 105/70  Pulse: 86  Temp: 97.9 F (36.6 C)  Weight: 150 lb (68 kg)  Body mass index is 27.88 kg/m.        Physical Examination:   General:  Alert, oriented and cooperative.   Mental Status: Normal mood and affect perceived. Normal judgment and thought content.  Rest of physical exam deferred due to type of  encounter  Results for orders placed or performed in visit on 11/07/18 (from the past 24 hour(s))  POC Urinalysis Dipstick OB   Collection Time: 11/07/18 11:01 AM  Result Value Ref Range   Color, UA     Clarity, UA     Glucose, UA Negative Negative   Bilirubin, UA     Ketones, UA neg    Spec Grav, UA     Blood, UA neg    pH, UA     POC,PROTEIN,UA Negative Negative, Trace, Small (1+), Moderate (2+), Large (3+), 4+   Urobilinogen, UA     Nitrite, UA neg    Leukocytes, UA Trace (A) Negative   Appearance     Odor      Assessment & Plan:  1) Pregnancy G3P2002 at [redacted]w[redacted]d with an Estimated Date of Delivery: 01/06/19   2) Constipation, discussed and gave printed prevention/relief measures    Meds: No orders of the defined types were placed in this encounter.   Labs/procedures today: tdap, rhogam  Plan:  Continue routine obstetrical care. Does not have BP cuff. Rx faxed to [facility] to mail to her.  Check bp weekly, let us know if >140/90.   Reviewed: Preterm labor symptoms and general obstetric precautions including but not limited to vaginal bleeding, contractions, leaking of fluid and fetal movement were reviewed in detail with the patient. The patient was advised to call back or seek an in-person office evaluation/go to MAU at [facility] for any urgent or concerning symptoms. All questions were answered. Please refer to After Visit Summary for other counseling recommendations.   I provided 8 minutes of face-to-face time  during this encounter.  Follow-up: Return in about 2 weeks (around 11/21/2018) for LROB webex.  Orders Placed This Encounter  Procedures  . Tdap vaccine greater than or equal to 7yo IM  . RHO (D) Immune Globulin  . POC Urinalysis Dipstick OB   Cheral MarkerKimberly R Jonathen Rathman CNM, [facility] 11/07/2018 11:37 AM

## 2018-11-21 ENCOUNTER — Other Ambulatory Visit: Payer: Self-pay

## 2018-11-21 ENCOUNTER — Ambulatory Visit (INDEPENDENT_AMBULATORY_CARE_PROVIDER_SITE_OTHER): Payer: Medicaid Other | Admitting: Women's Health

## 2018-11-21 ENCOUNTER — Encounter: Payer: Self-pay | Admitting: Women's Health

## 2018-11-21 VITALS — BP 133/81 | HR 81

## 2018-11-21 DIAGNOSIS — Z3A33 33 weeks gestation of pregnancy: Secondary | ICD-10-CM

## 2018-11-21 DIAGNOSIS — Z3483 Encounter for supervision of other normal pregnancy, third trimester: Secondary | ICD-10-CM

## 2018-11-21 NOTE — Progress Notes (Signed)
   TELEHEALTH VIRTUAL OBSTETRICS VISIT ENCOUNTER NOTE Patient name: Susan Salinas MRN [id number]  Date of birth: 06/03/1981  I connected with patient on 11/21/18 at 10:45 AM EDT by [facility] and verified that I am speaking with the correct person using two identifiers. Due to COVID-19 recommendations, pt is not currently in our office.    I discussed the limitations, risks, security and privacy concerns of performing an evaluation and management service by telephone and the availability of in person appointments. I also discussed with the patient that there may be a patient responsible charge related to this service. The patient expressed understanding and agreed to proceed.  Chief Complaint:   Routine Prenatal Visit  History of Present Illness:   Susan Salinas is a 38 y.o. G74P2002 female at [redacted]w[redacted]d with an Estimated Date of Delivery: 01/06/19 being evaluated today for ongoing management of a low-risk pregnancy.  Today she reports no complaints. Contractions: Irregular. Vag. Bleeding: None.  Movement: Present. denies leaking of fluid. Review of Systems:   Pertinent items are noted in HPI Denies abnormal vaginal discharge w/ itching/odor/irritation, headaches, visual changes, shortness of breath, chest pain, abdominal pain, severe nausea/vomiting, or problems with urination or bowel movements unless otherwise stated above. Pertinent History Reviewed:  Reviewed past medical,surgical, social, obstetrical and family history.  Reviewed problem list, medications and allergies. Physical Assessment:   Vitals:   11/21/18 1102  BP: 133/81  Pulse: 81  There is no height or weight on file to calculate BMI.        Physical Examination:   General:  Alert, oriented and cooperative.   Mental Status: Normal mood and affect perceived. Normal judgment and thought content.  Rest of physical exam deferred due to type of encounter  No results found for this or any previous visit (from the past 24 hour(s)).   Assessment & Plan:  1) Pregnancy G3P2002 at [redacted]w[redacted]d with an Estimated Date of Delivery: 01/06/19    Meds: No orders of the defined types were placed in this encounter.   Labs/procedures today: none  Plan:  Continue routine obstetrical care. Has BP cuff. Check bp weekly, let us know if >140/90.   Reviewed: Preterm labor symptoms and general obstetric precautions including but not limited to vaginal bleeding, contractions, leaking of fluid and fetal movement were reviewed in detail with the patient. The patient was advised to call back or seek an in-person office evaluation/go to MAU at [facility] for any urgent or concerning symptoms. All questions were answered. Please refer to After Visit Summary for other counseling recommendations.   I provided 10 minutes of non-face-to-face time during this encounter.  Follow-up: Return in about 2 weeks (around 12/05/2018) for LROB webex.  No orders of the defined types were placed in this encounter.  Cheral Marker CNM, [facility] 11/21/2018 11:13 AM

## 2018-11-21 NOTE — Progress Notes (Signed)
133

## 2018-11-21 NOTE — Patient Instructions (Signed)
Susan PolioJennifer Salinas, I greatly value your feedback.  If you receive a survey following your visit with us today, we appreciate you taking the time to fill it out.  Thanks, Susan HaffKim Zonie Salinas, CNM, [facility]NP-BC  [facility] It is now [facility] ([address] [place], [place] [address]) Entrance located off of [address] Free 24/7 valet parking   Home Blood Pressure Monitoring for Patients   Your provider has recommended that you check your blood pressure (BP) at least once a week at home. If you do not have a blood pressure cuff at home, one will be provided for you. Contact your provider if you have not received your monitor within 1 week.   Helpful Tips for Accurate Home Blood Pressure Checks  . Don't smoke, exercise, or drink caffeine 30 minutes before checking your BP . Use the restroom before checking your BP (a full bladder can raise your pressure) . Relax in a comfortable upright chair . Feet on the ground . Left arm resting comfortably on a flat surface at the level of your heart . Legs uncrossed . Back supported . Sit quietly and don't talk . Place the cuff on your bare arm . Adjust snuggly, so that only two fingertips can fit between your skin and the top of the cuff . Check 2 readings separated by at least one minute . Keep a log of your BP readings . For a visual, please reference this diagram: http://ccnc.care/bpdiagram  Provider Name: [facility]     Phone: [phone]  Zone 1: ALL CLEAR  Continue to monitor your symptoms:  . BP reading is less than 140 (top number) or less than 90 (bottom number)  . No right upper stomach pain . No headaches or seeing spots . No feeling nauseated or throwing up . No swelling in face and hands  Zone 2: CAUTION Call your doctor's office for any of the following:  . BP reading is greater than 140 (top number) or greater than 90 (bottom number)  . Stomach pain under your ribs in the middle  or right side . Headaches or seeing spots . Feeling nauseated or throwing up . Swelling in face and hands  Zone 3: EMERGENCY  Seek immediate medical care if you have any of the following:  . BP reading is greater than160 (top number) or greater than 110 (bottom number) . Severe headaches not improving with Tylenol . Serious difficulty catching your breath . Any worsening symptoms from Zone 2  Call the office [phone]) or go to [facility] if:  You begin to have strong, frequent contractions  Your water breaks.  Sometimes it is a big gush of fluid, sometimes it is just a trickle that keeps getting your panties wet or running down your legs  You have vaginal bleeding.  It is normal to have a small amount of spotting if your cervix was checked.   You don't feel your baby moving like normal.  If you don't, get you something to eat and drink and lay down and focus on feeling your baby move.  You should feel at least 10 movements in 2 hours.  If you don't, you should call the office or go to [facility].    [facility] Contractions Contractions of the uterus can occur throughout pregnancy, but they are not always a sign that you are in labor. You may have practice contractions called Braxton Hicks contractions. These false labor contractions  are sometimes confused with true labor. What are Deberah Pelton contractions? Braxton Hicks contractions are tightening movements that occur in the muscles of the uterus before labor. Unlike true labor contractions, these contractions do not result in opening (dilation) and thinning of the cervix. Toward the end of pregnancy (32-34 weeks), Braxton Hicks contractions can happen more often and may become stronger. These contractions are sometimes difficult to tell apart from true labor because they can be very uncomfortable. You should not feel embarrassed if you go to the hospital with false labor. Sometimes, the only way to tell if you are in  true labor is for your health care provider to look for changes in the cervix. The health care provider will do a physical exam and may monitor your contractions. If you are not in true labor, the exam should show that your cervix is not dilating and your water has not broken. If there are no other health problems associated with your pregnancy, it is completely safe for you to be sent home with false labor. You may continue to have Braxton Hicks contractions until you go into true labor. How to tell the difference between true labor and false labor True labor  Contractions last 30-70 seconds.  Contractions become very regular.  Discomfort is usually felt in the top of the uterus, and it spreads to the lower abdomen and low back.  Contractions do not go away with walking.  Contractions usually become more intense and increase in frequency.  The cervix dilates and gets thinner. False labor  Contractions are usually shorter and not as strong as true labor contractions.  Contractions are usually irregular.  Contractions are often felt in the front of the lower abdomen and in the groin.  Contractions may go away when you walk around or change positions while lying down.  Contractions get weaker and are shorter-lasting as time goes on.  The cervix usually does not dilate or become thin. Follow these instructions at home:   Take over-the-counter and prescription medicines only as told by your health care provider.  Keep up with your usual exercises and follow other instructions from your health care provider.  Eat and drink lightly if you think you are going into labor.  If Braxton Hicks contractions are making you uncomfortable: ? Change your position from lying down or resting to walking, or change from walking to resting. ? Sit and rest in a tub of warm water. ? Drink enough fluid to keep your urine pale yellow. Dehydration may cause these contractions. ? Do slow and deep  breathing several times an hour.  Keep all follow-up prenatal visits as told by your health care provider. This is important. Contact a health care provider if:  You have a fever.  You have continuous pain in your abdomen. Get help right away if:  Your contractions become stronger, more regular, and closer together.  You have fluid leaking or gushing from your vagina.  You pass blood-tinged mucus (bloody show).  You have bleeding from your vagina.  You have low back pain that you never had before.  You feel your baby's head pushing down and causing pelvic pressure.  Your baby is not moving inside you as much as it used to. Summary  Contractions that occur before labor are called Braxton Hicks contractions, false labor, or practice contractions.  Braxton Hicks contractions are usually shorter, weaker, farther apart, and less regular than true labor contractions. True labor contractions usually become progressively stronger and regular,  and they become more frequent.  Manage discomfort from [facility] contractions by changing position, resting in a warm bath, drinking plenty of water, or practicing deep breathing. This information is not intended to replace advice given to you by your health care provider. Make sure you discuss any questions you have with your health care provider. Document Released: 11/18/2016 Document Revised: 04/19/2017 Document Reviewed: 11/18/2016 Elsevier Interactive Patient Education  2019 [employer].  Coronavirus (COVID-19) Are you at risk?  Are you at risk for the Coronavirus (COVID-19)?  To be considered HIGH RISK for Coronavirus (COVID-19), you have to meet the following criteria:  . Traveled to [place], [place], [place], [place] or [place]; or in the [place] to [place], [place], [place], or [place]; and have fever, cough, and shortness of breath within the last 2 weeks of travel OR . Been in close contact with a person diagnosed  with COVID-19 within the last 2 weeks and have fever, cough, and shortness of breath . IF YOU DO NOT MEET THESE CRITERIA, YOU ARE CONSIDERED LOW RISK FOR COVID-19.  What to do if you are HIGH RISK for COVID-19?  Marland Kitchen If you are having a medical emergency, call 911. . Seek medical care right away. Before you go to a doctor's office, urgent care or emergency department, call ahead and tell them about your recent travel, contact with someone diagnosed with COVID-19, and your symptoms. You should receive instructions from your physician's office regarding next steps of care.  . When you arrive at healthcare provider, tell the healthcare staff immediately you have returned from visiting [place], [place], [place], [place] or [place]; or traveled in the [place] to [place], [place], [place], or [place]; in the last two weeks or you have been in close contact with a person diagnosed with COVID-19 in the last 2 weeks.   . Tell the health care staff about your symptoms: fever, cough and shortness of breath. . After you have been seen by a medical provider, you will be either: o Tested for (COVID-19) and discharged home on quarantine except to seek medical care if symptoms worsen, and asked to  - Stay home and avoid contact with others until you get your results (4-5 days)  - Avoid travel on public transportation if possible (such as bus, train, or airplane) or o Sent to the Emergency Department by EMS for evaluation, COVID-19 testing, and possible admission depending on your condition and test results.  What to do if you are LOW RISK for COVID-19?  Reduce your risk of any infection by using the same precautions used for avoiding the common cold or flu:  Marland Kitchen Wash your hands often with soap and warm water for at least 20 seconds.  If soap and water are not readily available, use an alcohol-based hand sanitizer with at least 60% alcohol.  . If coughing or sneezing, cover your mouth and nose by coughing  or sneezing into the elbow areas of your shirt or coat, into a tissue or into your sleeve (not your hands). . Avoid shaking hands with others and consider head nods or verbal greetings only. . Avoid touching your eyes, nose, or mouth with unwashed hands.  . Avoid close contact with people who are sick. . Avoid places or events with large numbers of people in one location, like concerts or sporting events. . Carefully consider travel plans you have or are making. . If you are planning any travel outside or inside  the [place], visit the CDC's Travelers' Health webpage for the latest health notices. . If you have some symptoms but not all symptoms, continue to monitor at home and seek medical attention if your symptoms worsen. . If you are having a medical emergency, call 911.   ADDITIONAL HEALTHCARE OPTIONS FOR PATIENTS  [facility] / e-Visit: [email or web address]         [facility]: [phone]  [facility]: [phone]                   [facility]: [phone]

## 2018-12-05 ENCOUNTER — Encounter: Payer: Medicaid Other | Admitting: Obstetrics & Gynecology

## 2018-12-08 ENCOUNTER — Telehealth: Payer: Self-pay | Admitting: Women's Health

## 2018-12-08 NOTE — Telephone Encounter (Signed)
°  Called pt Susan Salinas to bring mask to appointment and restrictions left on VM

## 2018-12-12 ENCOUNTER — Encounter: Payer: Self-pay | Admitting: Women's Health

## 2018-12-12 ENCOUNTER — Other Ambulatory Visit: Payer: Self-pay

## 2018-12-12 ENCOUNTER — Ambulatory Visit (INDEPENDENT_AMBULATORY_CARE_PROVIDER_SITE_OTHER): Payer: Medicaid Other | Admitting: Women's Health

## 2018-12-12 VITALS — BP 132/81 | HR 61 | Wt 155.0 lb

## 2018-12-12 DIAGNOSIS — Z3A36 36 weeks gestation of pregnancy: Secondary | ICD-10-CM | POA: Diagnosis not present

## 2018-12-12 DIAGNOSIS — Z331 Pregnant state, incidental: Secondary | ICD-10-CM

## 2018-12-12 DIAGNOSIS — Z3483 Encounter for supervision of other normal pregnancy, third trimester: Secondary | ICD-10-CM

## 2018-12-12 DIAGNOSIS — Z1389 Encounter for screening for other disorder: Secondary | ICD-10-CM

## 2018-12-12 LAB — POCT URINALYSIS DIPSTICK OB
Blood, UA: NEGATIVE
Glucose, UA: NEGATIVE
Ketones, UA: NEGATIVE
Leukocytes, UA: NEGATIVE
Nitrite, UA: NEGATIVE
POC,PROTEIN,UA: NEGATIVE

## 2018-12-12 NOTE — Progress Notes (Signed)
   LOW-RISK PREGNANCY VISIT Patient name: Susan Salinas MRN [id number]  Date of birth: [birth year] Chief Complaint:   Routine Prenatal Visit  History of Present Illness:   Susan Salinas is a 38 y.o. G73P2002 female at [redacted]w[redacted]d with an Estimated Date of Delivery: 01/06/19 being seen today for ongoing management of a low-risk pregnancy.  Today she reports no complaints. Contractions: Irregular. Vag. Bleeding: None.  Movement: Present. denies leaking of fluid. Review of Systems:   Pertinent items are noted in HPI Denies abnormal vaginal discharge w/ itching/odor/irritation, headaches, visual changes, shortness of breath, chest pain, abdominal pain, severe nausea/vomiting, or problems with urination or bowel movements unless otherwise stated above. Pertinent History Reviewed:  Reviewed past medical,surgical, social, obstetrical and family history.  Reviewed problem list, medications and allergies. Physical Assessment:   Vitals:   12/12/18 1025  BP: 132/81  Pulse: 61  Weight: 155 lb (70.3 kg)  Body mass index is 28.81 kg/m.        Physical Examination:   General appearance: Well appearing, and in no distress  Mental status: Alert, oriented to person, place, and time  Skin: Warm & dry  Cardiovascular: Normal heart rate noted  Respiratory: Normal respiratory effort, no distress  Abdomen: Soft, gravid, nontender  Pelvic: Cervical exam performed  Dilation: 1 Effacement (%): Thick Station: Ballotable  Extremities: Edema: None  Fetal Status: Fetal Heart Rate (bpm): 140 Fundal Height: 35 cm Movement: Present Presentation: Vertex  Results for orders placed or performed in visit on 12/12/18 (from the past 24 hour(s))  POC Urinalysis Dipstick OB   Collection Time: 12/12/18 10:25 AM  Result Value Ref Range   Color, UA     Clarity, UA     Glucose, UA Negative Negative   Bilirubin, UA     Ketones, UA neg    Spec Grav, UA     Blood, UA neg    pH, UA     POC,PROTEIN,UA Negative Negative,  Trace, Small (1+), Moderate (2+), Large (3+), 4+   Urobilinogen, UA     Nitrite, UA neg    Leukocytes, UA Negative Negative   Appearance     Odor      Assessment & Plan:  1) Low-risk pregnancy G3P2002 at 110w3d with an Estimated Date of Delivery: 01/06/19    Meds: No orders of the defined types were placed in this encounter.  Labs/procedures today: gbs, gc/ct, sve  Plan:  Continue routine obstetrical care   Reviewed: Preterm labor symptoms and general obstetric precautions including but not limited to vaginal bleeding, contractions, leaking of fluid and fetal movement were reviewed in detail with the patient.  All questions were answered  Follow-up: Return in about 1 week (around 12/19/2018) for LROB webex.  Orders Placed This Encounter  Procedures  . Culture, beta strep (group b only)  . GC/Chlamydia Probe Amp  . POC Urinalysis Dipstick OB   Cheral Marker CNM, [facility] 12/12/2018 11:11 AM

## 2018-12-12 NOTE — Patient Instructions (Signed)
Ronna PolioJennifer Bachicha, I greatly value your feedback.  If you receive a survey following your visit with us today, we appreciate you taking the time to fill it out.  Thanks, Joellyn HaffKim Ayron Fillinger, CNM, [facility]NP-BC  [facility] now [facility] ([address] [place], [place] [address]) Entrance located off of [address] Free 24/7 valet parking    Call the office [phone]) or go to [facility] if:  You begin to have strong, frequent contractions  Your water breaks.  Sometimes it is a big gush of fluid, sometimes it is just a trickle that keeps getting your panties wet or running down your legs  You have vaginal bleeding.  It is normal to have a small amount of spotting if your cervix was checked.   You don't feel your baby moving like normal.  If you don't, get you something to eat and drink and lay down and focus on feeling your baby move.  You should feel at least 10 movements in 2 hours.  If you don't, you should call the office or go to [facility].     Preterm Labor and Birth Information  The normal length of a pregnancy is 39-41 weeks. Preterm labor is when labor starts before 37 completed weeks of pregnancy. What are the risk factors for preterm labor? Preterm labor is more likely to occur in women who:  Have certain infections during pregnancy such as a bladder infection, sexually transmitted infection, or infection inside the uterus (chorioamnionitis).  Have a shorter-than-normal cervix.  Have gone into preterm labor before.  Have had surgery on their cervix.  Are younger than age 36 or older than age 36.  Are African American.  Are pregnant with twins or multiple babies (multiple gestation).  Take street drugs or smoke while pregnant.  Do not gain enough weight while pregnant.  Became pregnant shortly after having been pregnant. What are the symptoms of preterm labor? Symptoms of preterm labor include:  Cramps  similar to those that can happen during a menstrual period. The cramps may happen with diarrhea.  Pain in the abdomen or lower back.  Regular uterine contractions that may feel like tightening of the abdomen.  A feeling of increased pressure in the pelvis.  Increased watery or bloody mucus discharge from the vagina.  Water breaking (ruptured amniotic sac). Why is it important to recognize signs of preterm labor? It is important to recognize signs of preterm labor because babies who are born prematurely may not be fully developed. This can put them at an increased risk for:  Long-term (chronic) heart and lung problems.  Difficulty immediately after birth with regulating body systems, including blood sugar, body temperature, heart rate, and breathing rate.  Bleeding in the brain.  Cerebral palsy.  Learning difficulties.  Death. These risks are highest for babies who are born before 34 weeks of pregnancy. How is preterm labor treated? Treatment depends on the length of your pregnancy, your condition, and the health of your baby. It may involve:  Having a stitch (suture) placed in your cervix to prevent your cervix from opening too early (cerclage).  Taking or being given medicines, such as: ? Hormone medicines. These may be given early in pregnancy to help support the pregnancy. ? Medicine to stop contractions. ? Medicines to help mature the baby's lungs. These may be prescribed if the risk of delivery is high. ? Medicines to prevent your baby from developing cerebral palsy.  If the labor happens before 34 weeks of pregnancy, you may need to stay in the hospital. What should I do if I think I am in preterm labor? If you think that you are going into preterm labor, call your health care provider right away. How can I prevent preterm labor in future pregnancies? To increase your chance of having a full-term pregnancy:  Do not use any tobacco products, such as cigarettes, chewing  tobacco, and e-cigarettes. If you need help quitting, ask your health care provider.  Do not use street drugs or medicines that have not been prescribed to you during your pregnancy.  Talk with your health care provider before taking any herbal supplements, even if you have been taking them regularly.  Make sure you gain a healthy amount of weight during your pregnancy.  Watch for infection. If you think that you might have an infection, get it checked right away.  Make sure to tell your health care provider if you have gone into preterm labor before. This information is not intended to replace advice given to you by your health care provider. Make sure you discuss any questions you have with your health care provider. Document Released: 09/25/2003 Document Revised: 12/16/2015 Document Reviewed: 11/26/2015 Elsevier Interactive Patient Education  2019 [employer].

## 2018-12-15 ENCOUNTER — Encounter: Payer: Self-pay | Admitting: *Deleted

## 2018-12-16 LAB — GC/CHLAMYDIA PROBE AMP
Chlamydia trachomatis, NAA: NEGATIVE
Neisseria Gonorrhoeae by PCR: NEGATIVE

## 2018-12-16 LAB — CULTURE, BETA STREP (GROUP B ONLY): Strep Gp B Culture: NEGATIVE

## 2018-12-18 ENCOUNTER — Encounter: Payer: Medicaid Other | Admitting: Obstetrics and Gynecology

## 2019-01-25 ENCOUNTER — Ambulatory Visit: Payer: Medicaid Other | Admitting: Advanced Practice Midwife

## 2019-03-31 ENCOUNTER — Encounter (HOSPITAL_COMMUNITY): Payer: Self-pay

## 2019-04-14 ENCOUNTER — Inpatient Hospital Stay
Admission: RE | Admit: 2019-04-14 | Discharge: 2019-04-14 | Disposition: A | Discharge status: Home / Self Care | Payer: Medicaid Other | Source: Ambulatory Visit

## 2019-09-03 ENCOUNTER — Other Ambulatory Visit (HOSPITAL_COMMUNITY): Payer: Self-pay | Admitting: Internal Medicine

## 2019-09-03 DIAGNOSIS — N63 Unspecified lump in unspecified breast: Secondary | ICD-10-CM

## 2019-09-18 ENCOUNTER — Ambulatory Visit (HOSPITAL_COMMUNITY): Admission: RE | Admit: 2019-09-18 | Payer: Medicaid Other | Source: Ambulatory Visit

## 2019-09-18 ENCOUNTER — Inpatient Hospital Stay (HOSPITAL_COMMUNITY): Admission: RE | Admit: 2019-09-18 | Payer: Medicaid Other | Source: Ambulatory Visit

## 2019-09-18 ENCOUNTER — Encounter (HOSPITAL_COMMUNITY): Payer: Self-pay

## 2019-09-18 ENCOUNTER — Ambulatory Visit (HOSPITAL_COMMUNITY): Payer: Medicaid Other

## 2019-10-02 ENCOUNTER — Ambulatory Visit (HOSPITAL_COMMUNITY): Payer: Medicaid Other

## 2019-10-02 ENCOUNTER — Encounter (HOSPITAL_COMMUNITY): Payer: Medicaid Other

## 2019-10-16 ENCOUNTER — Other Ambulatory Visit: Payer: Self-pay

## 2019-10-16 ENCOUNTER — Ambulatory Visit (HOSPITAL_COMMUNITY)
Admission: RE | Admit: 2019-10-16 | Discharge: 2019-10-16 | Disposition: A | Discharge status: Home / Self Care | Payer: Medicaid Other | Source: Ambulatory Visit | Attending: Internal Medicine | Admitting: Internal Medicine

## 2019-10-16 DIAGNOSIS — N6310 Unspecified lump in the right breast, unspecified quadrant: Secondary | ICD-10-CM | POA: Diagnosis not present

## 2019-10-16 DIAGNOSIS — N63 Unspecified lump in unspecified breast: Secondary | ICD-10-CM | POA: Insufficient documentation

## 2019-10-16 DIAGNOSIS — R928 Other abnormal and inconclusive findings on diagnostic imaging of breast: Secondary | ICD-10-CM | POA: Insufficient documentation

## 2019-10-16 DIAGNOSIS — N6320 Unspecified lump in the left breast, unspecified quadrant: Secondary | ICD-10-CM | POA: Insufficient documentation

## 2020-05-12 ENCOUNTER — Telehealth: Payer: Medicaid Other | Admitting: Family

## 2020-05-12 DIAGNOSIS — J019 Acute sinusitis, unspecified: Secondary | ICD-10-CM | POA: Diagnosis not present

## 2020-05-12 MED ORDER — AMOXICILLIN-POT CLAVULANATE 875-125 MG PO TABS: 1.0000 | ORAL_TABLET | Freq: Two times a day (BID) | ORAL | 0 refills | Status: DC

## 2020-05-12 NOTE — Progress Notes (Signed)

## 2020-06-15 ENCOUNTER — Telehealth: Payer: Medicaid Other | Admitting: Physician Assistant

## 2020-06-15 DIAGNOSIS — N760 Acute vaginitis: Secondary | ICD-10-CM

## 2020-06-15 DIAGNOSIS — B9689 Other specified bacterial agents as the cause of diseases classified elsewhere: Secondary | ICD-10-CM | POA: Diagnosis not present

## 2020-06-16 MED ORDER — METRONIDAZOLE 500 MG PO TABS: 500.0000 mg | ORAL_TABLET | Freq: Two times a day (BID) | ORAL | 0 refills | Status: DC

## 2020-06-16 NOTE — Progress Notes (Signed)

## 2020-08-26 ENCOUNTER — Telehealth: Payer: Medicaid Other | Admitting: Physician Assistant

## 2020-08-26 DIAGNOSIS — J019 Acute sinusitis, unspecified: Secondary | ICD-10-CM

## 2020-08-26 DIAGNOSIS — B9689 Other specified bacterial agents as the cause of diseases classified elsewhere: Secondary | ICD-10-CM | POA: Diagnosis not present

## 2020-08-26 MED ORDER — DOXYCYCLINE HYCLATE 100 MG PO CAPS: 100.0000 mg | ORAL_CAPSULE | Freq: Two times a day (BID) | ORAL | 0 refills | Status: DC

## 2020-08-26 NOTE — Progress Notes (Signed)
I have spent 5 minutes in review of e-visit questionnaire, review and updating patient chart, medical decision making and response to patient.   Markell Schrier Cody Lashawnda Hancox, PA-C    

## 2020-08-26 NOTE — Progress Notes (Signed)

## 2020-11-01 ENCOUNTER — Telehealth: Payer: Medicaid Other | Admitting: Physician Assistant

## 2020-11-01 DIAGNOSIS — J0140 Acute pansinusitis, unspecified: Secondary | ICD-10-CM | POA: Diagnosis not present

## 2020-11-01 MED ORDER — AMOXICILLIN-POT CLAVULANATE 875-125 MG PO TABS: 1.0000 | ORAL_TABLET | Freq: Two times a day (BID) | ORAL | 0 refills | Status: DC

## 2020-11-01 NOTE — Progress Notes (Signed)
We are sorry that you are not feeling well.  Here is how we plan to help!  Based on what you have shared with me it looks like you have sinusitis.  Sinusitis is inflammation and infection in the sinus cavities of the head.  Based on your presentation I believe you most likely have Acute Bacterial Sinusitis.  This is an infection caused by bacteria and is treated with antibiotics. I have prescribed Augmentin 875mg/125mg one tablet twice daily with food, for 7 days. You may use an oral decongestant such as Mucinex D or if you have glaucoma or high blood pressure use plain Mucinex. Saline nasal spray help and can safely be used as often as needed for congestion.  If you develop worsening sinus pain, fever or notice severe headache and vision changes, or if symptoms are not better after completion of antibiotic, please schedule an appointment with a health care provider.    Sinus infections are not as easily transmitted as other respiratory infection, however we still recommend that you avoid close contact with loved ones, especially the very young and elderly.  Remember to wash your hands thoroughly throughout the day as this is the number one way to prevent the spread of infection!  Home Care:  Only take medications as instructed by your medical team.  Complete the entire course of an antibiotic.  Do not take these medications with alcohol.  A steam or ultrasonic humidifier can help congestion.  You can place a towel over your head and breathe in the steam from hot water coming from a faucet.  Avoid close contacts especially the very young and the elderly.  Cover your mouth when you cough or sneeze.  Always remember to wash your hands.  Get Help Right Away If:  You develop worsening fever or sinus pain.  You develop a severe head ache or visual changes.  Your symptoms persist after you have completed your treatment plan.  Make sure you  Understand these instructions.  Will watch your  condition.  Will get help right away if you are not doing well or get worse.  Your e-visit answers were reviewed by a board certified advanced clinical practitioner to complete your personal care plan.  Depending on the condition, your plan could have included both over the counter or prescription medications.  If there is a problem please reply  once you have received a response from your provider.  Your safety is important to us.  If you have drug allergies check your prescription carefully.    You can use MyChart to ask questions about today's visit, request a non-urgent call back, or ask for a work or school excuse for 24 hours related to this e-Visit. If it has been greater than 24 hours you will need to follow up with your provider, or enter a new e-Visit to address those concerns.  You will get an e-mail in the next two days asking about your experience.  I hope that your e-visit has been valuable and will speed your recovery. Thank you for using e-visits.  I provided 6 minutes of non face-to-face time during this encounter for chart review and documentation.   

## 2020-11-03 MED ORDER — DOXYCYCLINE HYCLATE 100 MG PO TABS: 100.0000 mg | ORAL_TABLET | Freq: Two times a day (BID) | ORAL | 0 refills | Status: DC

## 2020-11-03 NOTE — Addendum Note (Signed)
Addended by: Margaretann Loveless on: 11/03/2020 09:59 AM   Modules accepted: Orders

## 2020-12-26 ENCOUNTER — Telehealth: Payer: Medicaid Other | Admitting: Family

## 2020-12-26 ENCOUNTER — Other Ambulatory Visit: Payer: Self-pay | Admitting: Family

## 2020-12-26 DIAGNOSIS — H60502 Unspecified acute noninfective otitis externa, left ear: Secondary | ICD-10-CM | POA: Diagnosis not present

## 2020-12-26 MED ORDER — CIPRO HC 0.2-1 % OT SUSP: 3.0000 [drp] | Freq: Two times a day (BID) | OTIC | 0 refills | Status: DC

## 2020-12-26 MED ORDER — CIPROFLOXACIN-DEXAMETHASONE 0.3-0.1 % OT SUSP: 4.0000 [drp] | Freq: Two times a day (BID) | OTIC | 0 refills | Status: DC

## 2020-12-26 MED ORDER — NEOMYCIN-POLYMYXIN-HC 3.5-10000-1 OT SOLN: OTIC | 0 refills | Status: DC

## 2020-12-26 NOTE — Addendum Note (Signed)
Addended by: Waldon Merl on: 12/26/2020 08:19 AM   Modules accepted: Orders

## 2020-12-26 NOTE — Progress Notes (Signed)
E Visit for Swimmer's Ear  We are sorry that you are not feeling well. Here is how we plan to help!  I have prescribed: Ciprofloxin 0.2% and hydrocortisone 1% otic suspension 3 drops in affected ears twice daily for 7 days    In certain cases swimmer's ear may progress to a more serious bacterial infection of the middle or inner ear.  If you have a fever 102 and up and significantly worsening symptoms, this could indicate a more serious infection moving to the middle/inner and needs face to face evaluation in an office by a provider.  Your symptoms should improve over the next 3 days and should resolve in about 7 days.  HOME CARE:  Wash your hands frequently. Do not place the tip of the bottle on your ear or touch it with your fingers. You can take Acetominophen 650 mg every 4-6 hours as needed for pain.  If pain is severe or moderate, you can apply a heating pad (set on low) or hot water bottle (wrapped in a towel) to outer ear for 20 minutes.  This will also increase drainage. Avoid ear plugs Do not use Q-tips After showers, help the water run out by tilting your head to one side.  GET HELP RIGHT AWAY IF:  Fever is over 102.2 degrees. You develop progressive ear pain or hearing loss. Ear symptoms persist longer than 3 days after treatment.  MAKE SURE YOU:  Understand these instructions. Will watch your condition. Will get help right away if you are not doing well or get worse.  TO PREVENT SWIMMER'S EAR: Use a bathing cap or custom fitted swim molds to keep your ears dry. Towel off after swimming to dry your ears. Tilt your head or pull your earlobes to allow the water to escape your ear canal. If there is still water in your ears, consider using a hairdryer on the lowest setting.  Thank you for choosing an e-visit. Your e-visit answers were reviewed by a board certified advanced clinical practitioner to complete your personal care plan. Depending upon the condition, your  plan could have included both over the counter or prescription medications. Please review your pharmacy choice. Be sure that the pharmacy you have chosen is open so that you can pick up your prescription now.  If there is a problem you may message your provider in MyChart to have the prescription routed to another pharmacy. Your safety is important to [place]. If you have drug allergies check your prescription carefully.  For the next 24 hours, you can use MyChart to ask questions about today's visit, request a non-urgent call back, or ask for a work or school excuse from your e-visit provider. You will get an email in the next two days asking about your experience. I hope that your e-visit has been valuable and will speed your recovery.   Approximately 5 minutes was spent documenting and reviewing patient's chart.

## 2020-12-26 NOTE — Progress Notes (Signed)
Medication initially sent in by other provider not covered per pharmacy. Will send in Cortisporin otic instead.

## 2021-01-11 ENCOUNTER — Telehealth: Payer: Medicaid Other | Admitting: Emergency Medicine

## 2021-01-11 DIAGNOSIS — M549 Dorsalgia, unspecified: Secondary | ICD-10-CM

## 2021-01-11 DIAGNOSIS — R399 Unspecified symptoms and signs involving the genitourinary system: Secondary | ICD-10-CM

## 2021-01-11 DIAGNOSIS — R109 Unspecified abdominal pain: Secondary | ICD-10-CM

## 2021-01-11 NOTE — Progress Notes (Signed)
Based on what you shared with me, with pain in your back and belly with urinary symptoms, I feel your condition warrants further evaluation and I recommend that you be seen in a face to face visit for a physical exam and testing of your urine as you may have a kidney infection.   NOTE: There will be NO CHARGE for this eVisit   If you are having a true medical emergency please call 911.      For an urgent face to face visit, [place] has six urgent care centers for your convenience:     [facility] Health Urgent Care Center at [facility] Directions [phone] [address] [place], [place] [address]    [facility] Health Urgent Care Center [facility]) Get Driving Directions [phone] [address] [place], [place] [address]  [facility] Health Urgent Care Center [facility]) Get Driving Directions [phone] [address] [place],  [place]  [address]  [facility] Health Urgent Care at [facility] Get Driving Directions [phone] [address] [place], [place] [address]   [facility] Health Urgent Care at [facility] Get Driving Directions  [phone] [address] [place], [place] [address]   [facility] Health Urgent Care at [facility] Directions [phone] [address] [place], [place] [address]  Your MyChart E-visit questionnaire answers were reviewed by a board certified advanced clinical practitioner to complete your personal care plan based on your specific symptoms.  Thank you for using e-Visits.   Approximately 5 minutes was spent documenting and reviewing patient's chart.

## 2021-03-04 ENCOUNTER — Telehealth: Payer: Medicaid Other | Admitting: Physician Assistant

## 2021-03-04 DIAGNOSIS — B379 Candidiasis, unspecified: Secondary | ICD-10-CM

## 2021-03-04 DIAGNOSIS — T3695XA Adverse effect of unspecified systemic antibiotic, initial encounter: Secondary | ICD-10-CM

## 2021-03-05 MED ORDER — FLUCONAZOLE 150 MG PO TABS: 150.0000 mg | ORAL_TABLET | Freq: Once | ORAL | 0 refills | Status: AC

## 2021-03-05 NOTE — Progress Notes (Signed)
I have spent 5 minutes in review of e-visit questionnaire, review and updating patient chart, medical decision making and response to patient.   Azka Steger Cody Jerline Linzy, PA-C    

## 2021-03-05 NOTE — Progress Notes (Signed)

## 2021-03-23 ENCOUNTER — Telehealth: Payer: Medicaid Other | Admitting: Physician Assistant

## 2021-03-23 DIAGNOSIS — B9689 Other specified bacterial agents as the cause of diseases classified elsewhere: Secondary | ICD-10-CM

## 2021-03-23 DIAGNOSIS — N760 Acute vaginitis: Secondary | ICD-10-CM

## 2021-03-23 MED ORDER — CLINDAMYCIN PHOSPHATE 2 % VA CREA: 1.0000 | TOPICAL_CREAM | Freq: Every day | VAGINAL | 0 refills | Status: DC

## 2021-03-23 NOTE — Progress Notes (Signed)
E-Visit for Vaginal Symptoms  We are sorry that you are not feeling well. Here is how we plan to help! Based on what you shared with me it looks like you: May have a vaginosis due to bacteria  Vaginosis is an inflammation of the vagina that can result in discharge, itching and pain. The cause is usually a change in the normal balance of vaginal bacteria or an infection. Vaginosis can also result from reduced estrogen levels after menopause.  The most common causes of vaginosis are:   Bacterial vaginosis which results from an overgrowth of one on several organisms that are normally present in your vagina.   Yeast infections which are caused by a naturally occurring fungus called candida.   Vaginal atrophy (atrophic vaginosis) which results from the thinning of the vagina from reduced estrogen levels after menopause.   Trichomoniasis which is caused by a parasite and is commonly transmitted by sexual intercourse.  Factors that increase your risk of developing vaginosis include: Medications, such as antibiotics and steroids Uncontrolled diabetes Use of hygiene products such as bubble bath, vaginal spray or vaginal deodorant Douching Wearing damp or tight-fitting clothing Using an intrauterine device (IUD) for birth control Hormonal changes, such as those associated with pregnancy, birth control pills or menopause Sexual activity Having a sexually transmitted infection  Your treatment plan is Clindamycin vaginal cream 5 grams applied vaginally for 7 days.  I have electronically sent this prescription into the pharmacy that you have chosen.  Be sure to take all of the medication as directed. Stop taking any medication if you develop a rash, tongue swelling or shortness of breath. Mothers who are breast feeding should consider pumping and discarding their breast milk while on these antibiotics. However, there is no consensus that infant exposure at these doses would be harmful.  Remember  that medication creams can weaken latex condoms.  Of note, boric acid suppositories can help to balance the pH and make it more acidic as it should be. Also vaginal probiotics (there are vaginal suppository ones and oral ones, you could choose which you prefer).   HOME CARE:  Good hygiene may prevent some types of vaginosis from recurring and may relieve some symptoms:  Avoid baths, hot tubs and whirlpool spas. Rinse soap from your outer genital area after a shower, and dry the area well to prevent irritation. Don't use scented or harsh soaps, such as those with deodorant or antibacterial action. Avoid irritants. These include scented tampons and pads. Wipe from front to back after using the toilet. Doing so avoids spreading fecal bacteria to your vagina.  Other things that may help prevent vaginosis include:  Don't douche. Your vagina doesn't require cleansing other than normal bathing. Repetitive douching disrupts the normal organisms that reside in the vagina and can actually increase your risk of vaginal infection. Douching won't clear up a vaginal infection. Use a latex condom. Both female and female latex condoms may help you avoid infections spread by sexual contact. Wear cotton underwear. Also wear pantyhose with a cotton crotch. If you feel comfortable without it, skip wearing underwear to bed. Yeast thrives in [employer] Your symptoms should improve in the next day or two.  GET HELP RIGHT AWAY IF:  You have pain in your lower abdomen ( pelvic area or over your ovaries) You develop nausea or vomiting You develop a fever Your discharge changes or worsens You have persistent pain with intercourse You develop shortness of breath, a rapid pulse, or you faint.  These symptoms could be signs of problems or infections that need to be evaluated by a medical provider now.  MAKE SURE YOU   Understand these instructions. Will watch your condition. Will get help right away if you  are not doing well or get worse.  Thank you for choosing an e-visit.  Your e-visit answers were reviewed by a board certified advanced clinical practitioner to complete your personal care plan. Depending upon the condition, your plan could have included both over the counter or prescription medications.  Please review your pharmacy choice. Make sure the pharmacy is open so you can pick up prescription now. If there is a problem, you may contact your provider through [employer] and have the prescription routed to another pharmacy.  Your safety is important to [place]. If you have drug allergies check your prescription carefully.   For the next 24 hours you can use MyChart to ask questions about today's visit, request a non-urgent call back, or ask for a work or school excuse. You will get an email in the next two days asking about your experience. I hope that your e-visit has been valuable and will speed your recovery.  I provided 6 minutes of non face-to-face time during this encounter for chart review and documentation.

## 2021-03-24 MED ORDER — METRONIDAZOLE 500 MG PO TABS: 500.0000 mg | ORAL_TABLET | Freq: Three times a day (TID) | ORAL | 0 refills | Status: AC

## 2021-03-24 NOTE — Addendum Note (Signed)
Addended by: Dierdre Forth on: 03/24/2021 12:24 PM   Modules accepted: Orders

## 2021-04-02 ENCOUNTER — Telehealth: Payer: Medicaid Other | Admitting: Emergency Medicine

## 2021-04-02 DIAGNOSIS — N898 Other specified noninflammatory disorders of vagina: Secondary | ICD-10-CM | POA: Diagnosis not present

## 2021-04-02 MED ORDER — FLUCONAZOLE 150 MG PO TABS: 150.0000 mg | ORAL_TABLET | Freq: Once | ORAL | 0 refills | Status: AC

## 2021-04-02 NOTE — Progress Notes (Signed)

## 2021-05-16 ENCOUNTER — Telehealth: Payer: Medicaid Other | Admitting: Nurse Practitioner

## 2021-05-16 DIAGNOSIS — H5710 Ocular pain, unspecified eye: Secondary | ICD-10-CM

## 2021-05-16 NOTE — Progress Notes (Signed)
Based on what you shared with me it looks like you have eye pain,that should be evaluated in a face to face office visit. If you are only having eye pain you need to see an eye doctor. Eye pain with no other symptoms can be something serious and needs to be assessed.  NOTE: There will be NO CHARGE for this eVisit   If you are having a true medical emergency please call 911.      For an urgent face to face visit, [facility] has six urgent care centers for your convenience:     [facility] Health Urgent Care Center at [facility] Directions [phone] [address] [place], [place] [address]    [facility] Health Urgent Care Center [facility]) Get Driving Directions [phone] [address] [place], [place] [address]  [facility] Health Urgent Care Center [facility]) Get Driving Directions [phone] [address] [place],  [place]  [address]  [facility] Health Urgent Care at [facility] Get Driving Directions [phone] [address] [place], [place] [address]   [facility] Health Urgent Care at [facility] Get Driving Directions  [phone] [address] [place], [place] [address]   [facility] Health Urgent Care at [facility] Directions [phone] [address] [place], [place] [address]  Your MyChart E-visit questionnaire answers were reviewed by a board certified advanced clinical practitioner to complete your personal care plan based on your specific symptoms.  Thank you for using e-Visits.

## 2021-06-11 IMAGING — MG DIGITAL DIAGNOSTIC BILAT W/ TOMO W/ CAD
8 series · 8 of 24 positions shown · non-contrast
Comparison: November 15, 2017

CLINICAL DATA: 39-year-old patient presents for delayed follow-up
of bilateral probably benign masses. She has no new areas of
concern. The patient has had a baby since her prior ultrasound
images were performed.

EXAM:
DIGITAL DIAGNOSTIC BILATERAL MAMMOGRAM WITH CAD AND TOMO
ULTRASOUND BILATERAL BREAST

[L MLO synth-2D]
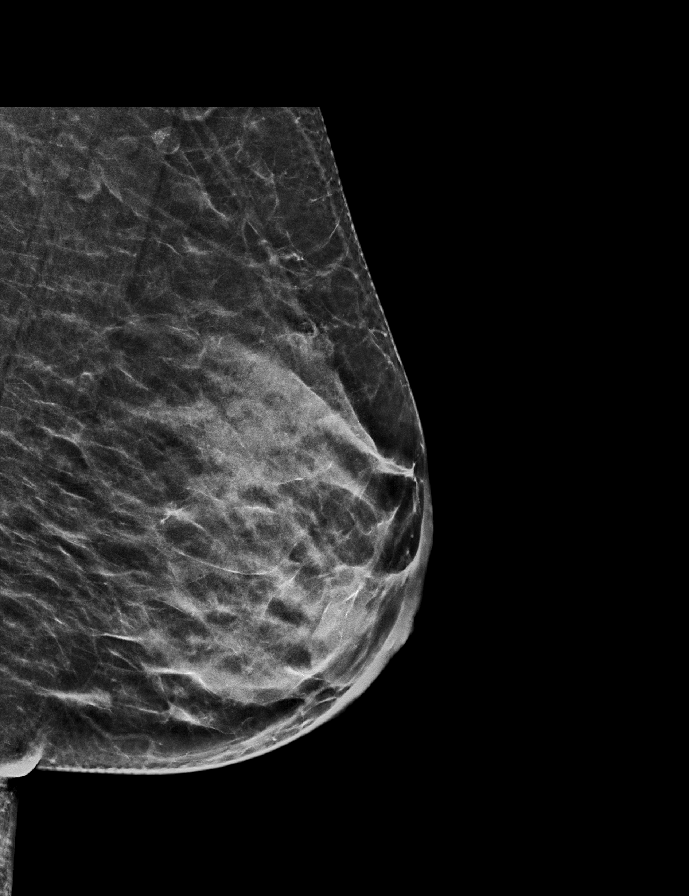

[L CC synth-2D]
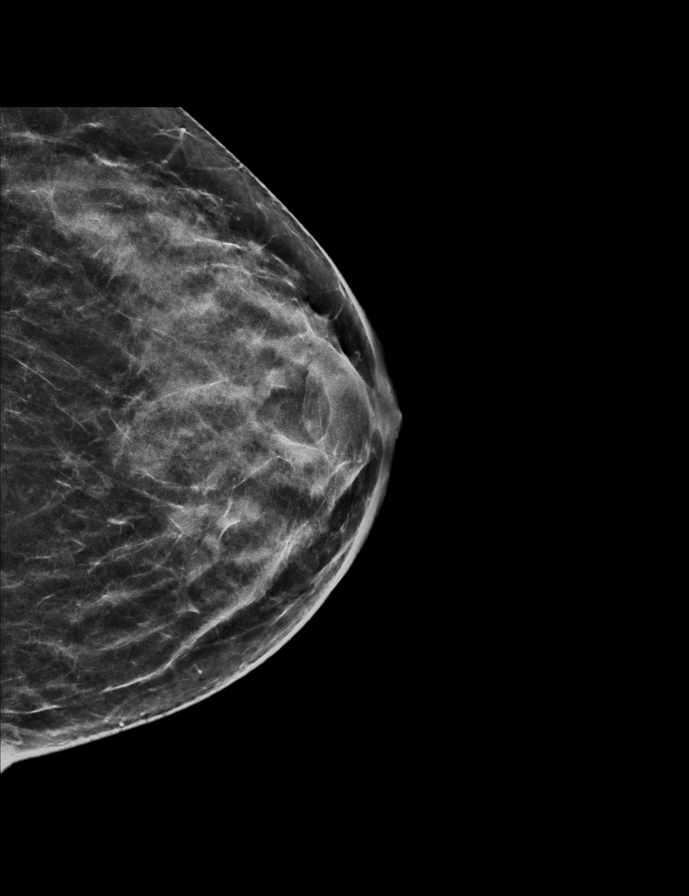

[R MLO synth-2D]
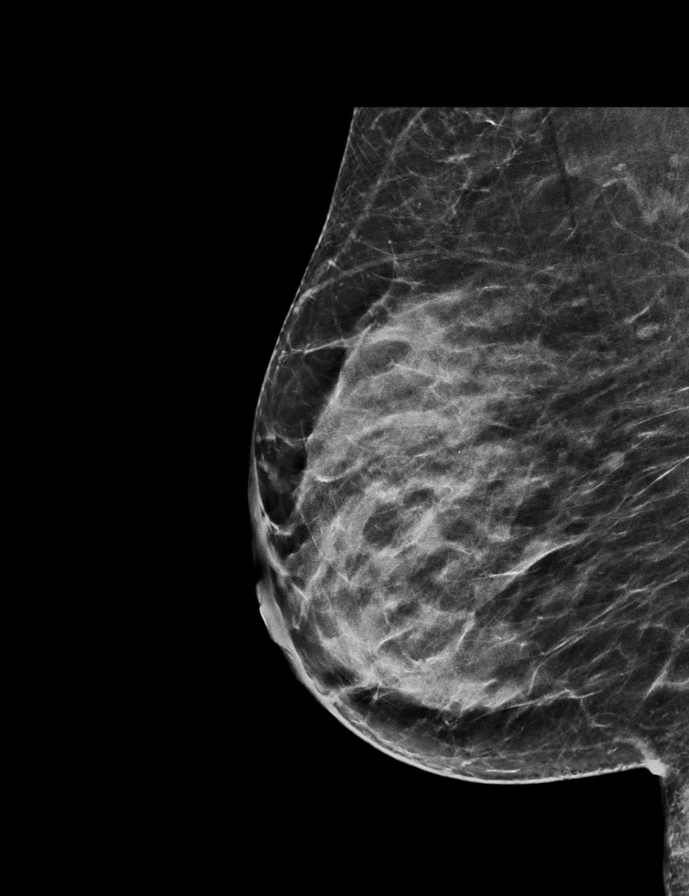

[R CC synth-2D]
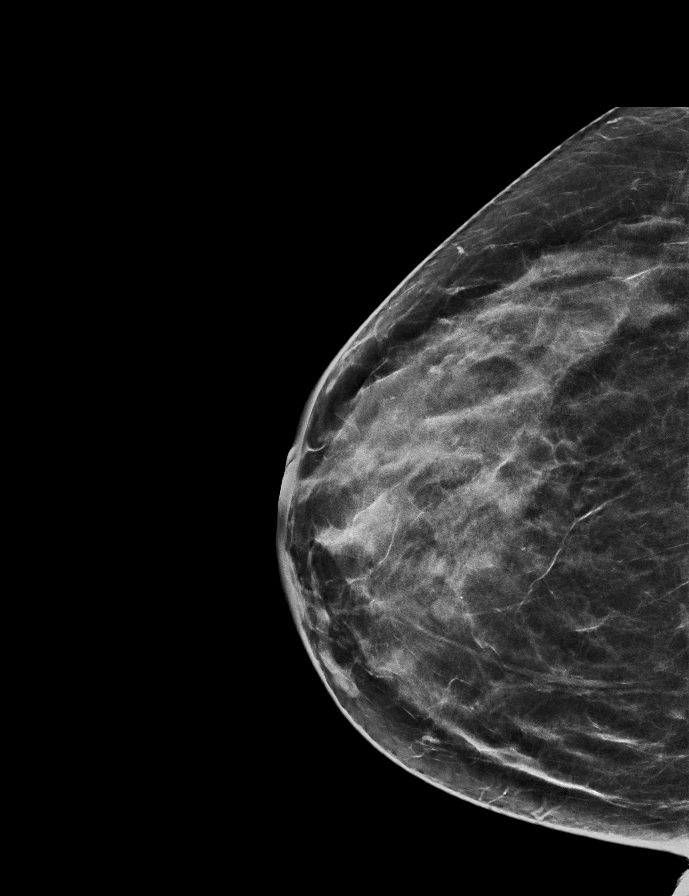

[R CC tomo · tomo slice 28/55.0]
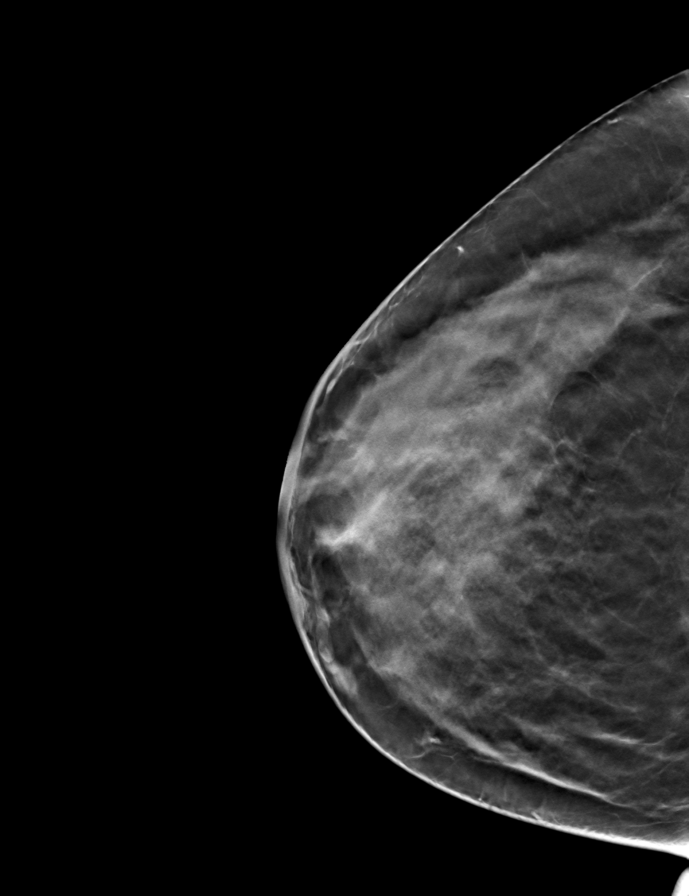

[L CC tomo · tomo slice 27/52.0]
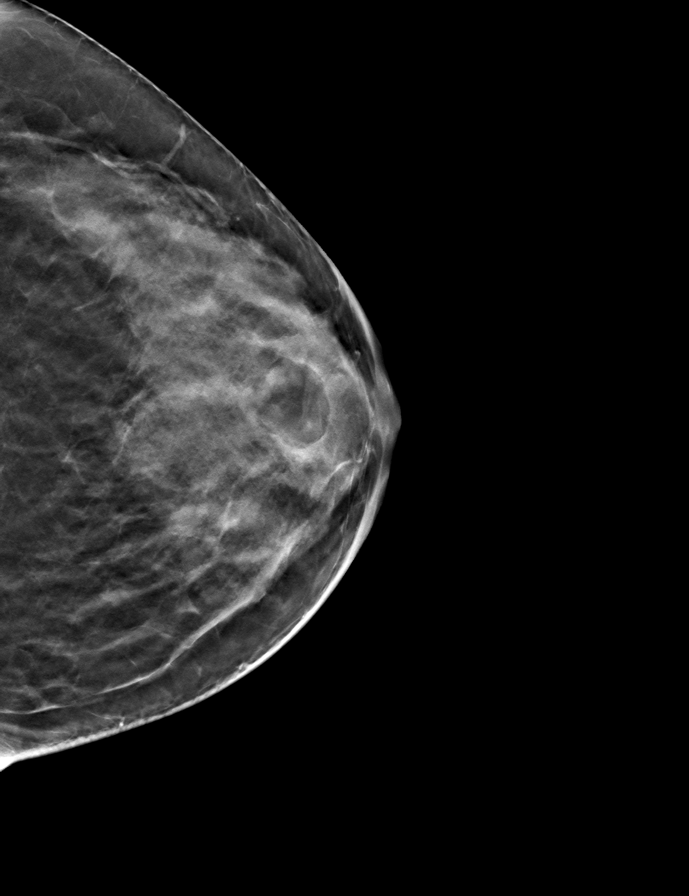

[L MLO tomo · tomo slice 28/55.0]
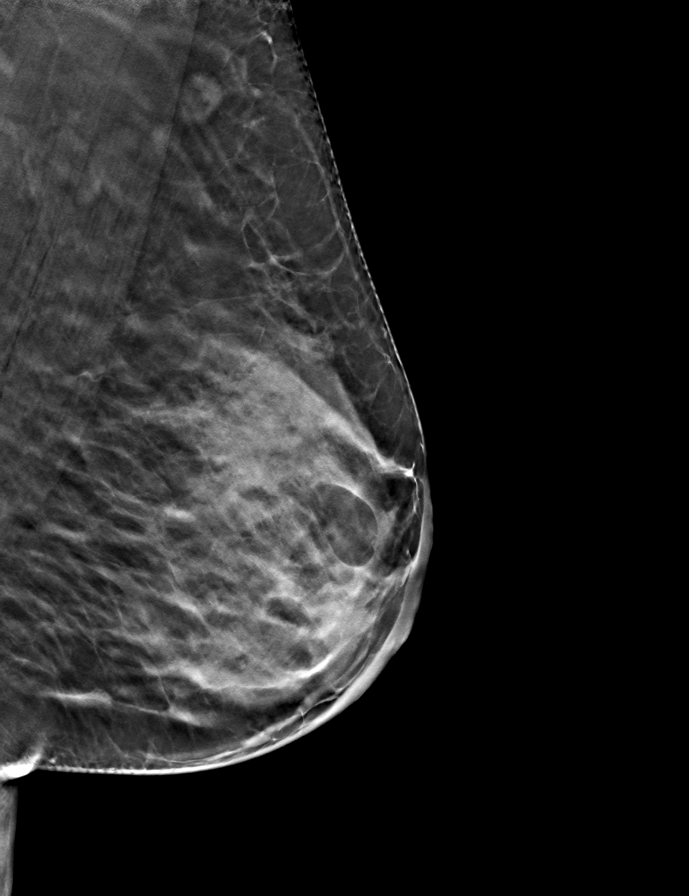

[R MLO tomo · tomo slice 30/59.0]
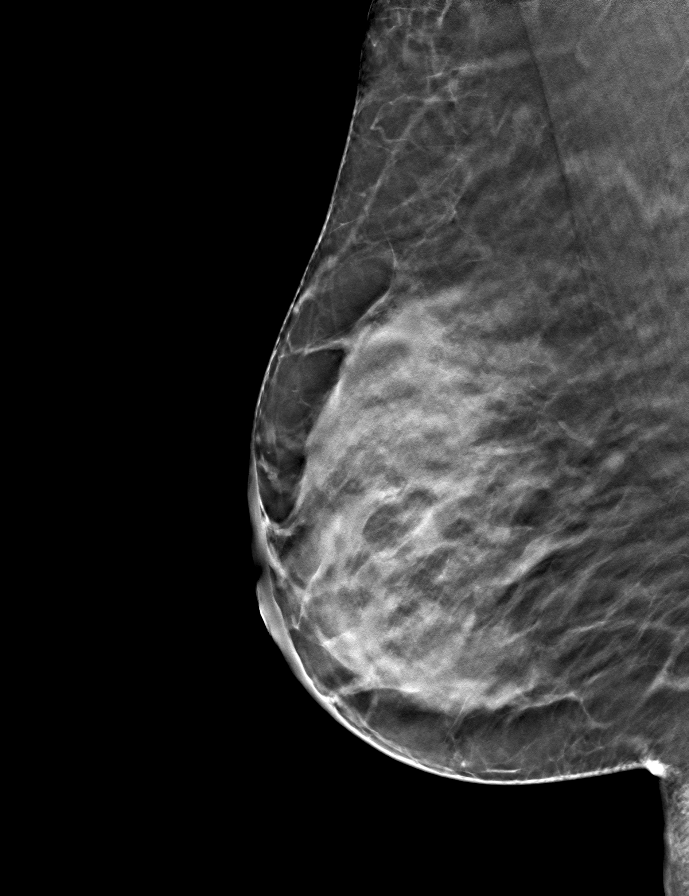

[8 of 24 positions shown; findings below may reference images not displayed]

ACR Breast Density Category c: The breast tissue is heterogeneously
dense, which may obscure small masses.
FINDINGS: Stable mammographic appearance of a left retroareolar circumscribed
oval mass. Stable circumscribed 0.6 cm mass in the superior right
breast. No new or suspicious mass, suspicious microcalcification, or
architectural distortion is identified in either breast to suggest
malignancy.

Mammographic images were processed with CAD.

On physical exam, there is a smooth mobile retroareolar left breast
mass measuring approximately 2.5 cm on physical exam. This has
physical exam characteristics consistent with fibroadenoma.

Targeted ultrasound is performed, showing:

In the right breast: At 1 o'clock position 4 cm from the nipple is a
circumscribed oval mass measuring 0.8 x 0.6 x 1.0 cm. This mass is
stable in size and likely reflects a benign fibroadenoma. Previously
in October 2017 it measured 0.9 x 0.7 x 1.0 cm.

In the right breast at 2 o'clock position 5 cm from the nipple is a
circumscribed oval parallel hypoechoic mass measuring 0.4 x 0.5 x
0.5 cm. This is stable in size, previously measuring 0.4 x 0.4 x
cm in Tuesday October, 2017. This mass has imaging features suggestive of a
benign fibroadenoma.

In the left breast: There is a circumscribed parallel oval slightly
hypoechoic mass measuring 2.4 x 1.1 x 2.0 cm. This has imaging
features suggestive of fibroadenoma. This mass has minimally
increased since the prior, previously measuring 2.3 x 0.9 x 1.2 cm.
The patient has had an intervening pregnancy, which can cause
fibroadenomas to slightly increase in size.
IMPRESSION: Three benign appearing masses, one in the left breast and two in the
right breast have imaging features consistent with fibroadenomas. No
significant change in these masses over time.

No evidence of malignancy in either breast.

RECOMMENDATION:
Screening mammogram at age 42 unless there are persistent or
intervening clinical concerns. (Code:BX-E-MRF)

I have discussed the findings and recommendations with the patient.
If applicable, a reminder letter will be sent to the patient
regarding the next appointment.

BI-RADS CATEGORY  2: Benign.

## 2021-06-11 IMAGING — US US BREAST*L* LIMITED INC AXILLA
1 series · 13 of 20 positions shown · non-contrast
Comparison: November 15, 2017

CLINICAL DATA: 39-year-old patient presents for delayed follow-up
of bilateral probably benign masses. She has no new areas of
concern. The patient has had a baby since her prior ultrasound
images were performed.

EXAM:
DIGITAL DIAGNOSTIC BILATERAL MAMMOGRAM WITH CAD AND TOMO
ULTRASOUND BILATERAL BREAST

[Series 1: us breast*left* limited inc axilla · 0.07mm/px · 13 of 20 slices shown]
[im 1/20]
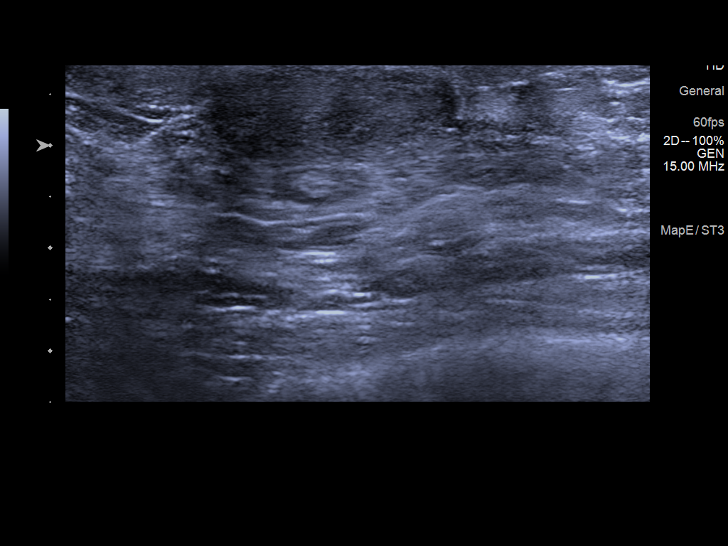
[im 3/20]
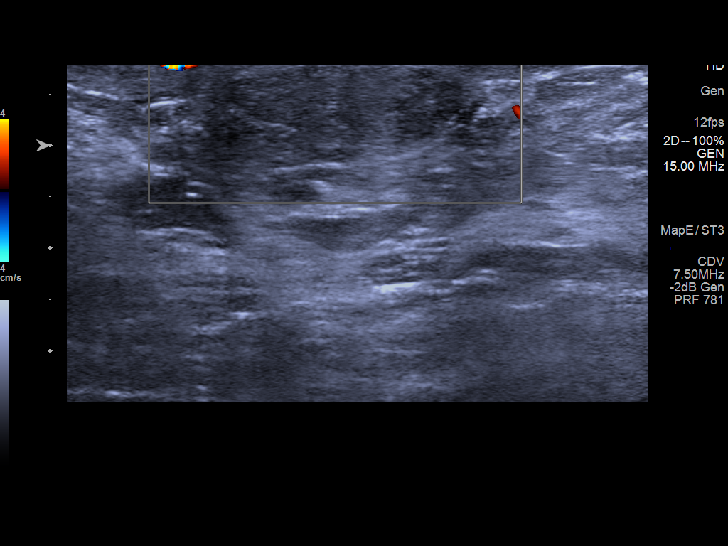
[im 4/20]
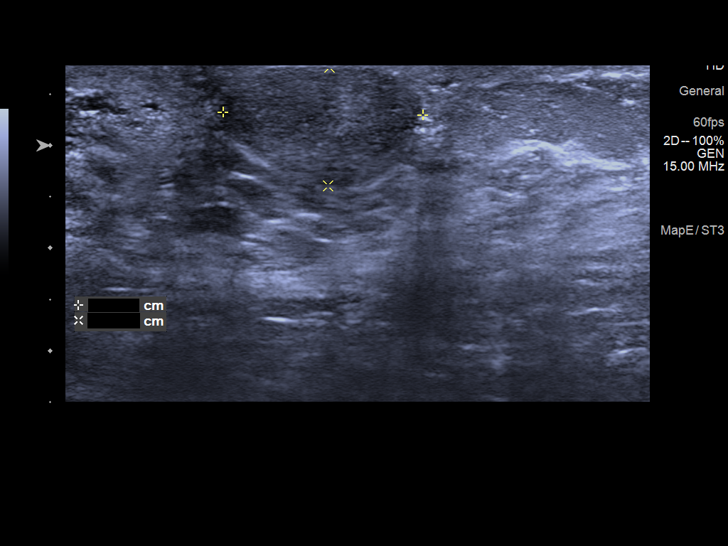
[im 6/20]
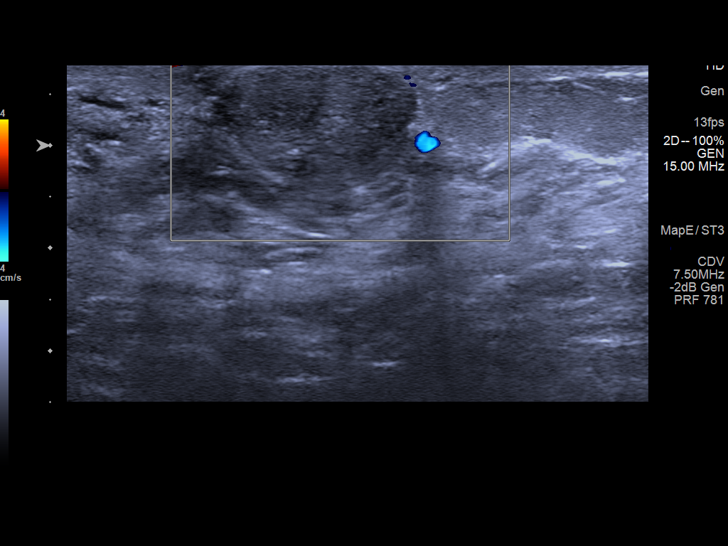
[im 7/20]
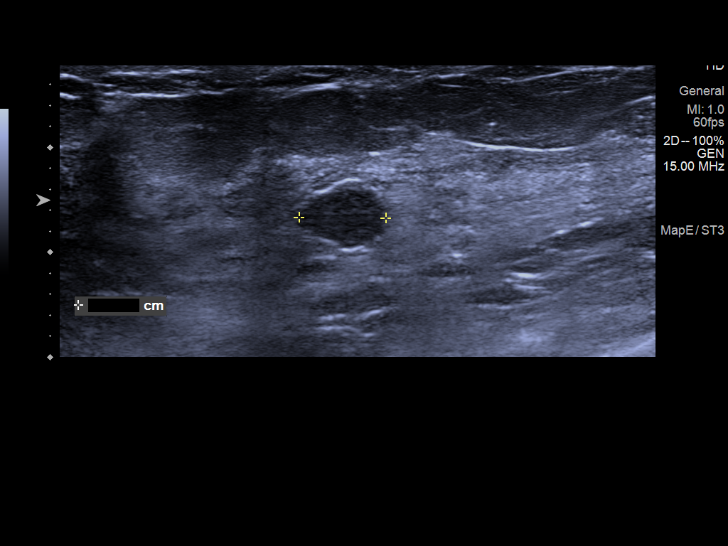
[im 9/20]
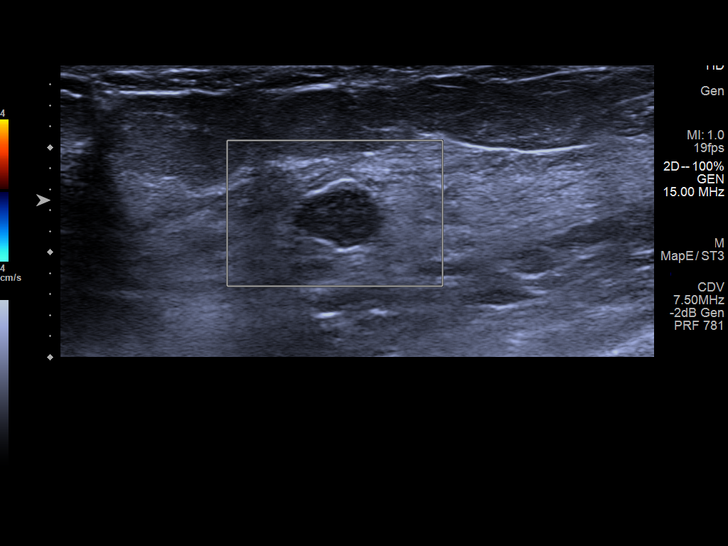
[im 11/20]
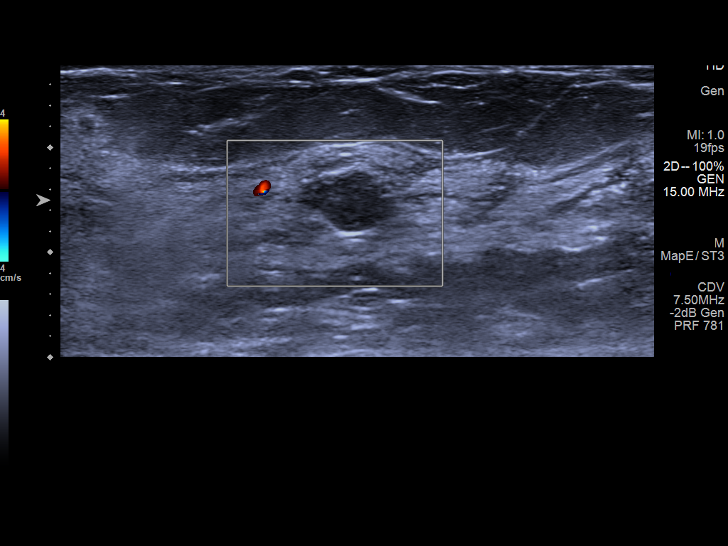
[im 12/20]
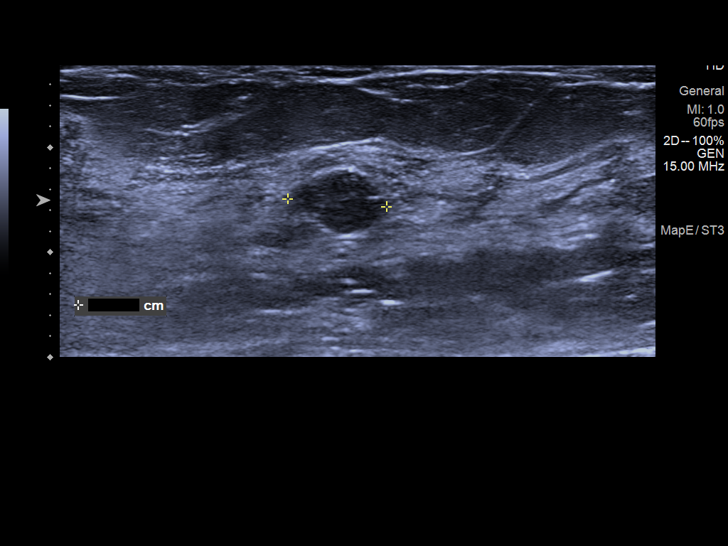
[im 14/20]
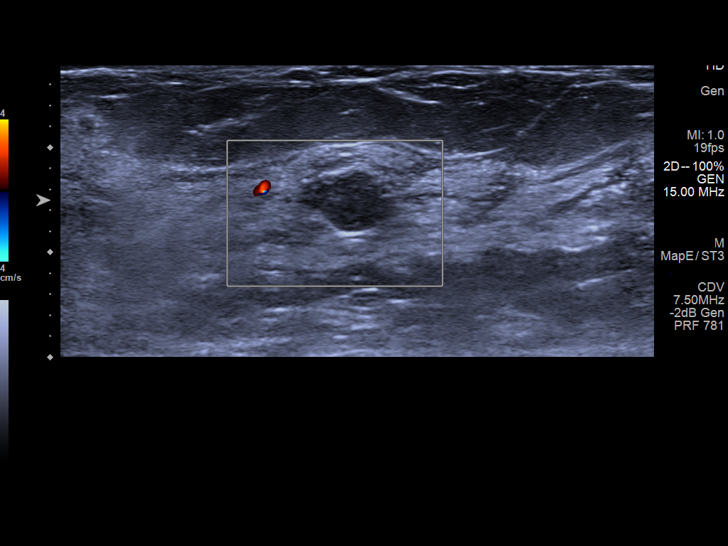
[im 15/20]
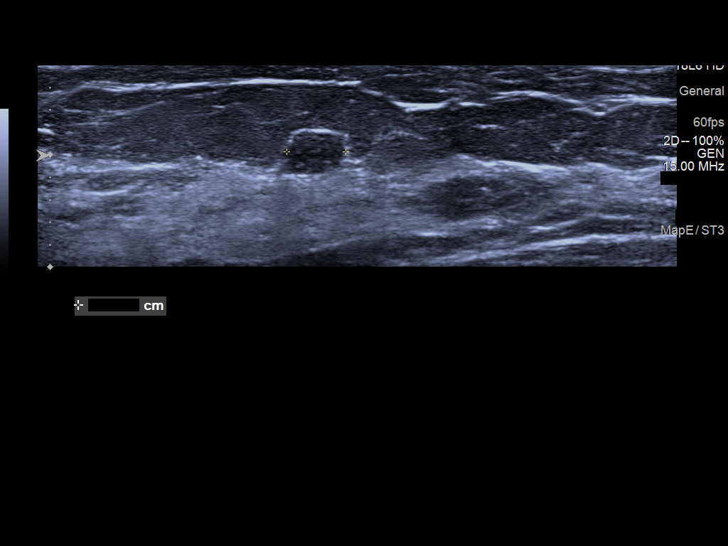
[im 17/20]
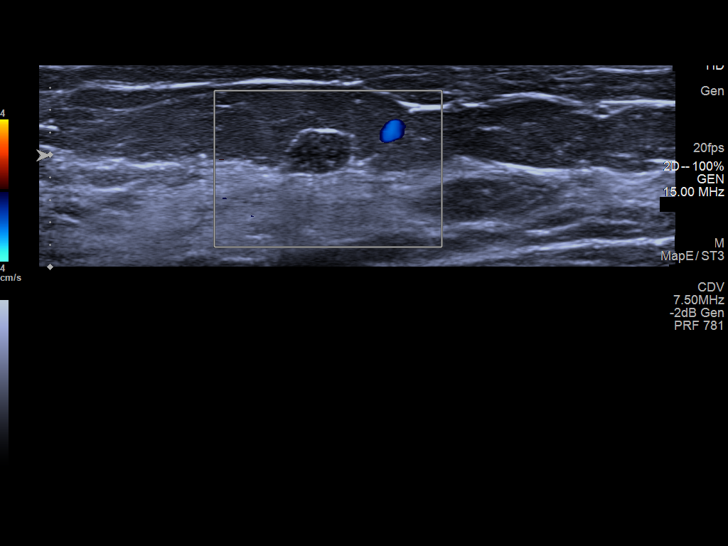
[im 18/20]
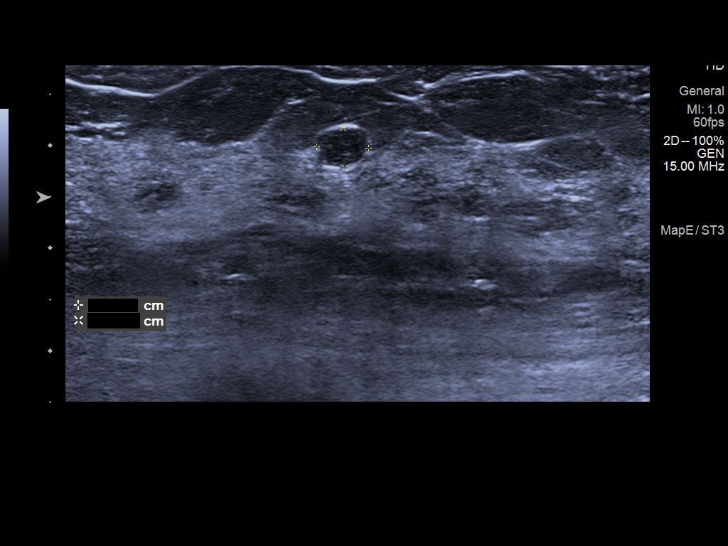
[im 20/20]
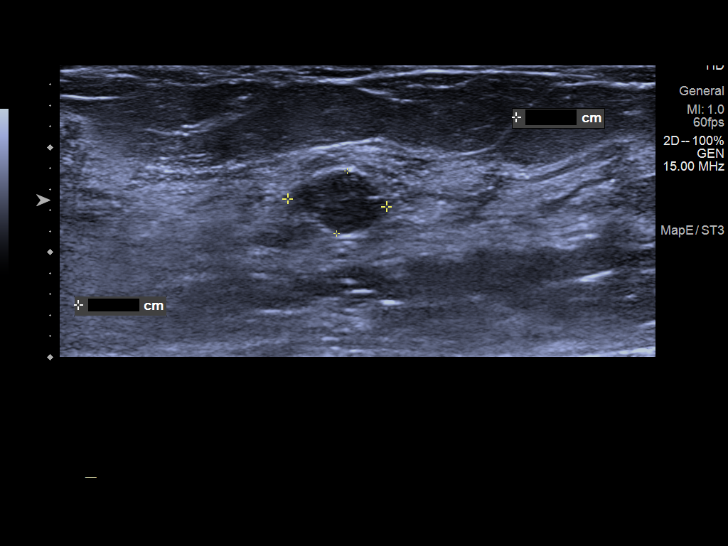

[13 of 20 positions shown; findings below may reference images not displayed]

ACR Breast Density Category c: The breast tissue is heterogeneously
dense, which may obscure small masses.
FINDINGS: Stable mammographic appearance of a left retroareolar circumscribed
oval mass. Stable circumscribed 0.6 cm mass in the superior right
breast. No new or suspicious mass, suspicious microcalcification, or
architectural distortion is identified in either breast to suggest
malignancy.

Mammographic images were processed with CAD.

On physical exam, there is a smooth mobile retroareolar left breast
mass measuring approximately 2.5 cm on physical exam. This has
physical exam characteristics consistent with fibroadenoma.

Targeted ultrasound is performed, showing:

In the right breast: At 1 o'clock position 4 cm from the nipple is a
circumscribed oval mass measuring 0.8 x 0.6 x 1.0 cm. This mass is
stable in size and likely reflects a benign fibroadenoma. Previously
in October 2017 it measured 0.9 x 0.7 x 1.0 cm.

In the right breast at 2 o'clock position 5 cm from the nipple is a
circumscribed oval parallel hypoechoic mass measuring 0.4 x 0.5 x
0.5 cm. This is stable in size, previously measuring 0.4 x 0.4 x
cm in Tuesday October, 2017. This mass has imaging features suggestive of a
benign fibroadenoma.

In the left breast: There is a circumscribed parallel oval slightly
hypoechoic mass measuring 2.4 x 1.1 x 2.0 cm. This has imaging
features suggestive of fibroadenoma. This mass has minimally
increased since the prior, previously measuring 2.3 x 0.9 x 1.2 cm.
The patient has had an intervening pregnancy, which can cause
fibroadenomas to slightly increase in size.
IMPRESSION: Three benign appearing masses, one in the left breast and two in the
right breast have imaging features consistent with fibroadenomas. No
significant change in these masses over time.

No evidence of malignancy in either breast.

RECOMMENDATION:
Screening mammogram at age 42 unless there are persistent or
intervening clinical concerns. (Code:BX-E-MRF)

I have discussed the findings and recommendations with the patient.
If applicable, a reminder letter will be sent to the patient
regarding the next appointment.

BI-RADS CATEGORY  2: Benign.

## 2021-08-17 ENCOUNTER — Telehealth: Payer: Medicaid Other | Admitting: Emergency Medicine

## 2021-08-17 DIAGNOSIS — B9689 Other specified bacterial agents as the cause of diseases classified elsewhere: Secondary | ICD-10-CM | POA: Diagnosis not present

## 2021-08-17 DIAGNOSIS — J019 Acute sinusitis, unspecified: Secondary | ICD-10-CM

## 2021-08-17 MED ORDER — DOXYCYCLINE HYCLATE 100 MG PO TABS: 100.0000 mg | ORAL_TABLET | Freq: Two times a day (BID) | ORAL | 0 refills | Status: DC

## 2021-08-17 NOTE — Progress Notes (Signed)
E-Visit for Sinus Problems ° °We are sorry that you are not feeling well.  Here is how we plan to help! ° °Based on what you have shared with me it looks like you have sinusitis.  Sinusitis is inflammation and infection in the sinus cavities of the head.  Based on your presentation I believe you most likely have Acute Bacterial Sinusitis.  This is an infection caused by bacteria and is treated with antibiotics. I have prescribed Doxycycline 100mg by mouth twice a day for 10 days. You may use an oral decongestant such as Mucinex D or if you have glaucoma or high blood pressure use plain Mucinex. Saline nasal spray help and can safely be used as often as needed for congestion.  If you develop worsening sinus pain, fever or notice severe headache and vision changes, or if symptoms are not better after completion of antibiotic, please schedule an appointment with a health care provider.   ° °Sinus infections are not as easily transmitted as other respiratory infection, however we still recommend that you avoid close contact with loved ones, especially the very young and elderly.  Remember to wash your hands thoroughly throughout the day as this is the number one way to prevent the spread of infection! ° °Home Care: °Only take medications as instructed by your medical team. °Complete the entire course of an antibiotic. °Do not take these medications with alcohol. °A steam or ultrasonic humidifier can help congestion.  You can place a towel over your head and breathe in the steam from hot water coming from a faucet. °Avoid close contacts especially the very young and the elderly. °Cover your mouth when you cough or sneeze. °Always remember to wash your hands. ° °Get Help Right Away If: °You develop worsening fever or sinus pain. °You develop a severe head ache or visual changes. °Your symptoms persist after you have completed your treatment plan. ° °Make sure you °Understand these instructions. °Will watch your  condition. °Will get help right away if you are not doing well or get worse. ° °Thank you for choosing an e-visit. ° °Your e-visit answers were reviewed by a board certified advanced clinical practitioner to complete your personal care plan. Depending upon the condition, your plan could have included both over the counter or prescription medications. ° °Please review your pharmacy choice. Make sure the pharmacy is open so you can pick up prescription now. If there is a problem, you may contact your provider through MyChart messaging and have the prescription routed to another pharmacy.  Your safety is important to us. If you have drug allergies check your prescription carefully.  ° °For the next 24 hours you can use MyChart to ask questions about today's visit, request a non-urgent call back, or ask for a work or school excuse. °You will get an email in the next two days asking about your experience. I hope that your e-visit has been valuable and will speed your recovery. ° °I have spent 5 minutes in review of e-visit questionnaire, review and updating patient chart, medical decision making and response to patient.  ° °Othal Kubitz, PhD, FNP-BC °  ° °

## 2022-03-23 ENCOUNTER — Telehealth: Payer: Medicaid Other | Admitting: Physician Assistant

## 2022-03-23 DIAGNOSIS — B9689 Other specified bacterial agents as the cause of diseases classified elsewhere: Secondary | ICD-10-CM

## 2022-03-23 DIAGNOSIS — J019 Acute sinusitis, unspecified: Secondary | ICD-10-CM

## 2022-03-23 MED ORDER — DOXYCYCLINE HYCLATE 100 MG PO TABS: 100.0000 mg | ORAL_TABLET | Freq: Two times a day (BID) | ORAL | 0 refills | Status: DC

## 2022-03-23 NOTE — Progress Notes (Signed)
I have spent 5 minutes in review of e-visit questionnaire, review and updating patient chart, medical decision making and response to patient.   Hatice Bubel Cody Krystianna Soth, PA-C    

## 2022-03-23 NOTE — Progress Notes (Signed)

## 2022-05-25 ENCOUNTER — Telehealth: Payer: Medicaid Other | Admitting: Physician Assistant

## 2022-05-25 DIAGNOSIS — J019 Acute sinusitis, unspecified: Secondary | ICD-10-CM | POA: Diagnosis not present

## 2022-05-25 DIAGNOSIS — B9689 Other specified bacterial agents as the cause of diseases classified elsewhere: Secondary | ICD-10-CM | POA: Diagnosis not present

## 2022-05-25 MED ORDER — DOXYCYCLINE HYCLATE 100 MG PO TABS: 100.0000 mg | ORAL_TABLET | Freq: Two times a day (BID) | ORAL | 0 refills | Status: DC

## 2022-05-25 NOTE — Progress Notes (Signed)

## 2022-05-25 NOTE — Progress Notes (Signed)
I have spent 5 minutes in review of e-visit questionnaire, review and updating patient chart, medical decision making and response to patient.   Teyla Skidgel Cody Annet Manukyan, PA-C    

## 2022-10-13 ENCOUNTER — Ambulatory Visit: Payer: Medicaid Other | Admitting: Adult Health

## 2022-10-21 ENCOUNTER — Telehealth: Payer: Medicaid Other | Admitting: Family Medicine

## 2022-10-21 DIAGNOSIS — B9689 Other specified bacterial agents as the cause of diseases classified elsewhere: Secondary | ICD-10-CM

## 2022-10-21 DIAGNOSIS — J019 Acute sinusitis, unspecified: Secondary | ICD-10-CM

## 2022-10-22 MED ORDER — PREDNISONE 20 MG PO TABS: 20.0000 mg | ORAL_TABLET | Freq: Two times a day (BID) | ORAL | 0 refills | Status: AC

## 2022-10-22 NOTE — Progress Notes (Signed)
E-Visit for Sinus Problems  We are sorry that you are not feeling well.  Here is how we plan to help!  Based on what you have shared with me it looks like you have sinusitis.  Sinusitis is inflammation and infection in the sinus cavities of the head.  Based on your presentation I believe you most likely have Acute Bacterial Sinusitis.  This is an infection caused by bacteria and is treated with antibiotics. I have prescribed prednisone.  You may use an oral decongestant such as Mucinex D or if you have glaucoma or high blood pressure use plain Mucinex. Saline nasal spray help and can safely be used as often as needed for congestion.  If you develop worsening sinus pain, fever or notice severe headache and vision changes, or if symptoms are not better after completion of antibiotic, please schedule an appointment with a health care provider.    Sinus infections are not as easily transmitted as other respiratory infection, however we still recommend that you avoid close contact with loved ones, especially the very young and elderly.  Remember to wash your hands thoroughly throughout the day as this is the number one way to prevent the spread of infection!  Home Care: Only take medications as instructed by your medical team. Complete the entire course of an antibiotic. Do not take these medications with alcohol. A steam or ultrasonic humidifier can help congestion.  You can place a towel over your head and breathe in the steam from hot water coming from a faucet. Avoid close contacts especially the very young and the elderly. Cover your mouth when you cough or sneeze. Always remember to wash your hands.  Get Help Right Away If: You develop worsening fever or sinus pain. You develop a severe head ache or visual changes. Your symptoms persist after you have completed your treatment plan.  Make sure you Understand these instructions. Will watch your condition. Will get help right away if you are  not doing well or get worse.  Thank you for choosing an e-visit.  Your e-visit answers were reviewed by a board certified advanced clinical practitioner to complete your personal care plan. Depending upon the condition, your plan could have included both over the counter or prescription medications.  Please review your pharmacy choice. Make sure the pharmacy is open so you can pick up prescription now. If there is a problem, you may contact your provider through [employer] and have the prescription routed to another pharmacy.  Your safety is important to [place]. If you have drug allergies check your prescription carefully.   For the next 24 hours you can use MyChart to ask questions about today's visit, request a non-urgent call back, or ask for a work or school excuse. You will get an email in the next two days asking about your experience. I hope that your e-visit has been valuable and will speed your recovery.    have provided 5 minutes of non face to face time during this encounter for chart review and documentation.

## 2022-11-29 ENCOUNTER — Ambulatory Visit: Payer: Medicaid Other | Admitting: Adult Health

## 2023-01-07 ENCOUNTER — Ambulatory Visit: Payer: Medicaid Other

## 2023-01-14 ENCOUNTER — Telehealth: Payer: Medicaid Other | Admitting: Physician Assistant

## 2023-01-14 DIAGNOSIS — H9209 Otalgia, unspecified ear: Secondary | ICD-10-CM

## 2023-01-15 ENCOUNTER — Ambulatory Visit
Admission: EM | Admit: 2023-01-15 | Discharge: 2023-01-15 | Disposition: A | Discharge status: Home / Self Care | Payer: Medicaid Other | Attending: Family Medicine | Admitting: Family Medicine

## 2023-01-15 ENCOUNTER — Other Ambulatory Visit: Payer: Self-pay

## 2023-01-15 ENCOUNTER — Encounter: Payer: Self-pay | Admitting: Emergency Medicine

## 2023-01-15 DIAGNOSIS — R42 Dizziness and giddiness: Secondary | ICD-10-CM

## 2023-01-15 DIAGNOSIS — J3089 Other allergic rhinitis: Secondary | ICD-10-CM | POA: Diagnosis not present

## 2023-01-15 DIAGNOSIS — H6121 Impacted cerumen, right ear: Secondary | ICD-10-CM

## 2023-01-15 MED ORDER — FLUTICASONE PROPIONATE 50 MCG/ACT NA SUSP: 1.0000 | Freq: Two times a day (BID) | NASAL | 2 refills | Status: AC

## 2023-01-15 MED ORDER — MECLIZINE HCL 25 MG PO TABS: 25.0000 mg | ORAL_TABLET | Freq: Three times a day (TID) | ORAL | 0 refills | Status: DC | PRN

## 2023-01-15 MED ORDER — CETIRIZINE HCL 10 MG PO TABS: 10.0000 mg | ORAL_TABLET | Freq: Every day | ORAL | 2 refills | Status: DC

## 2023-01-15 NOTE — Progress Notes (Signed)
Because of your symptoms, I feel your condition warrants further evaluation and I recommend that you be seen in a face to face visit for in person evaluation of your ear to determine if you have an ear infection and evaluation of vertigo symptoms.    NOTE: There will be NO CHARGE for this eVisit   If you are having a true medical emergency please call 911.      For an urgent face to face visit, [place] has eight urgent care centers for your convenience:   NEW!! [facility] at [facility] Get Driving Directions [phone] [address] [place], [address]    [facility] at [facility] Get Driving Directions [phone] [address] [place], [place] [address]   [facility] Center [facility]) Get Driving Directions [phone] [address] [place], [place] [address]  [facility] Center [facility]) Get Driving Directions [phone] [address] [place],  [place]  [address]  [facility] Center [facility] - at [employer]  [phone] [place],  [place] [address]   [facility] Urgent Care at [facility] Get Driving Directions [phone] [address] [place], [place] [address]   [facility] Ur[facility] Care at [facility] Get Driving Directions  [phone] [address] [place], [place] [address]   [facility] Urgent Care at [facility] Directions [phone] [address] [place], [place] [address]  Your MyChart E-visit questionnaire answers were reviewed by a board certified advanced clinical practitioner to complete your personal care plan based on your specific symptoms.  Thank you for using e-Visits.    Tylene Fantasia Ward, PA-C

## 2023-01-15 NOTE — ED Provider Notes (Signed)
[facility]    CSN: [id number] Arrival date & time: 01/15/23  0907      History   Chief Complaint Chief Complaint  Patient presents with   Dizziness    Sinus ear and dizzy spells - Entered by patient    HPI Susan Salinas is a 42 y.o. female.   Patient presenting today with about 2 weeks of intermittent dizziness typically with movement of head.  States was seen at another urgent care last Monday and treated with prednisone and antibiotics for middle ear effusion with no benefit in symptoms.  States she stays congested in her sinuses likely from allergies and is inconsistent with allergy medication, wonders if this is playing a role.  Denies nausea, vomiting, falls, severe headache, vision changes, speech or swallowing changes.    Past Medical History:  Diagnosis Date   Abnormal Pap smear of cervix    Anxiety    Contraceptive management 07/31/2013   Depression    History of abnormal cervical Pap smear 07/31/2013   HPV (human papilloma virus) infection    Nausea and vomiting during pregnancy 10/08/2015   Pregnant 10/08/2015    Patient Active Problem List   Diagnosis Date Noted   Marijuana use 06/05/2018   Supervision of normal pregnancy 06/01/2018   Left breast mass 11/07/2017   Rh negative state in antepartum period 11/26/2015   Depression 10/08/2015   Dysplasia of cervix, low grade (CIN 1) 08/15/2013    Past Surgical History:  Procedure Laterality Date   COLPOSCOPY      OB History     Gravida  3   Para  2   Term  2   Preterm      AB  0   Living  2      SAB      IAB  0   Ectopic      Multiple  0   Live Births  2            Home Medications    Prior to Admission medications   Medication Sig Start Date End Date Taking? Authorizing Provider  cetirizine (ZYRTEC ALLERGY) 10 MG tablet Take 1 tablet (10 mg total) by mouth daily. 01/15/23  Yes Particia Nearing, PA-C  fluticasone [facility]) 50 MCG/ACT nasal spray Place 1  spray into both nostrils 2 (two) times daily. 01/15/23  Yes Particia Nearing, PA-C  meclizine (ANTIVERT) 25 MG tablet Take 1 tablet (25 mg total) by mouth 3 (three) times daily as needed for dizziness. 01/15/23  Yes Particia Nearing, PA-C  Blood Pressure Monitor MISC For regular home bp monitoring during pregnancy 11/07/18   Cheral Marker, CNM  doxycycline (VIBRA-TABS) 100 MG tablet Take 1 tablet (100 mg total) by mouth 2 (two) times daily. 05/25/22   Waldon Merl, PA-C  Prenatal MV & Min w/FA-DHA (PRENATAL ADULT GUMMY/DHA/FA PO) Take by mouth. Takes 2 daily    [provider]    Family History Family History  Problem Relation Age of Onset   COPD Paternal Grandmother     Social History Social History   Tobacco Use   Smoking status: Never   Smokeless tobacco: Never  Vaping Use   Vaping Use: Never used  Substance Use Topics   Alcohol use: No   Drug use: No     Allergies   Patient has no known allergies.   Review of Systems Review of Systems PER HPI  Physical Exam Triage Vital Signs ED Triage Vitals  Enc Vitals Group     BP 01/15/23 0916 122/86     Pulse Rate 01/15/23 0916 79     Resp 01/15/23 0916 20     Temp 01/15/23 0916 97.7 F (36.5 C)     Temp Source 01/15/23 0916 Oral     SpO2 01/15/23 0916 98 %     Weight --      Height --      Head Circumference --      Peak Flow --      Pain Score 01/15/23 0915 0     Pain Loc --      Pain Edu? --      Excl. in GC? --    No data found.  Updated Vital Signs BP 122/86 (BP Location: Right Arm)   Pulse 79   Temp 97.7 F (36.5 C) (Oral)   Resp 20   SpO2 98%   Breastfeeding No   Visual Acuity Right Eye Distance:   Left Eye Distance:   Bilateral Distance:    Right Eye Near:   Left Eye Near:    Bilateral Near:     Physical Exam Vitals and nursing note reviewed.  Constitutional:      Appearance: Normal appearance. She is not ill-appearing.  HENT:     Head: Atraumatic.     Right  Ear: There is impacted cerumen.     Left Ear: Tympanic membrane normal.     Nose: Congestion present.     Mouth/Throat:     Mouth: Mucous membranes are moist.     Pharynx: Oropharynx is clear.  Eyes:     Extraocular Movements: Extraocular movements intact.     Conjunctiva/sclera: Conjunctivae normal.  Cardiovascular:     Rate and Rhythm: Normal rate and regular rhythm.     Heart sounds: Normal heart sounds.  Pulmonary:     Effort: Pulmonary effort is normal.     Breath sounds: Normal breath sounds.  Musculoskeletal:        General: Normal range of motion.     Cervical back: Normal range of motion and neck supple.  Skin:    General: Skin is warm and dry.  Neurological:     Mental Status: She is alert and oriented to person, place, and time.     Cranial Nerves: No cranial nerve deficit.     Motor: No weakness.     Gait: Gait normal.  Psychiatric:        Mood and Affect: Mood normal.        Thought Content: Thought content normal.        Judgment: Judgment normal.    UC Treatments / Results  Labs (all labs ordered are listed, but only abnormal results are displayed) Labs Reviewed - No data to display  EKG  Radiology No results found.  Procedures Procedures (including critical care time)  Medications Ordered in UC Medications - No data to display  Initial Impression / Assessment and Plan / UC Course  I have reviewed the triage vital signs and the nursing notes.  Pertinent labs & imaging results that were available during my care of the patient were reviewed by me and considered in my medical decision making (see chart for details).     Symptoms consistent with vertigo, treat with meclizine, Epley maneuvers and monitor for improvement.  Peroxide and warm water used for ear lavage for right EAC with full clearance of wax impaction.  TM visualized and benign post procedure and patient tolerated  well.  Zyrtec and Flonase for ongoing seasonal allergy issues.  Final  Clinical Impressions(s) / UC Diagnoses   Final diagnoses:  Vertigo  Impacted cerumen of right ear  Seasonal allergic rhinitis due to other allergic trigger   Discharge Instructions   None    ED Prescriptions     Medication Sig Dispense Auth. Provider   meclizine (ANTIVERT) 25 MG tablet Take 1 tablet (25 mg total) by mouth 3 (three) times daily as needed for dizziness. 30 tablet Particia Nearing, [place]   cetirizine (ZYRTEC ALLERGY) 10 MG tablet Take 1 tablet (10 mg total) by mouth daily. 30 tablet Particia Nearing, PA-C   fluticasone [facility]) 50 MCG/ACT nasal spray Place 1 spray into both nostrils 2 (two) times daily. 16 g Particia Nearing, [place]      PDMP not reviewed this encounter.   Particia Nearing, [place] 01/15/23 1409

## 2023-01-15 NOTE — ED Triage Notes (Signed)
Pt reports intermittent dizziness with movement x2 weeks. Was seen at another facility last Monday and states was diagnosed with fluid in left ear given prednisone and abx but reports no improvement in symptoms.

## 2023-02-02 ENCOUNTER — Telehealth: Payer: Medicaid Other | Admitting: Nurse Practitioner

## 2023-02-02 DIAGNOSIS — H60332 Swimmer's ear, left ear: Secondary | ICD-10-CM | POA: Diagnosis not present

## 2023-02-02 MED ORDER — CIPROFLOXACIN-DEXAMETHASONE 0.3-0.1 % OT SUSP: 4.0000 [drp] | Freq: Two times a day (BID) | OTIC | 0 refills | Status: AC

## 2023-02-02 NOTE — Progress Notes (Signed)
E Visit for Ear Pain - Swimmer's Ear  We are sorry that you are not feeling well. Here is how we plan to help!  Based on what you have shared with me it looks like you have Swimmer's Ear.  Swimmer's ear is a redness or swelling, irritation, or infection of your outer ear canal. These symptoms usually occur within a few days of swimming. Your ear canal is a tube that goes from the opening of the ear to the eardrum.  When water stays in your ear canal, germs can grow.  This is a painful condition that often happens to children and swimmers of all ages.  It is not contagious and oral antibiotics are not required to treat uncomplicated swimmer's ear.  The usual symptoms include:    Itchiness inside the ear  Redness or a sense of swelling in the ear  Pain when the ear is tugged on when pressure is placed on the ear  Pus draining from the infected ear  We have prescribed:  Meds ordered this encounter  Medications   ciprofloxacin-dexamethasone (CIPRODEX) OTIC suspension    Sig: Place 4 drops into the left ear 2 (two) times daily for 7 days.    Dispense:  7.5 mL    Refill:  0     In certain cases, swimmer's ear may progress to a more serious bacterial infection of the middle or inner ear.  If you have a fever 102 and up and significantly worsening symptoms, this could indicate a more serious infection moving to the middle/inner and needs face to face evaluation in an office by a provider.  Your symptoms should improve over the next 3 days and should resolve in about 7 days.  Be sure to complete ALL of your prescription.  HOME CARE: Wash your hands frequently. If you are prescribed an ear drop, do not place the tip of the bottle on your ear or touch it with your fingers. You can take Acetaminophen 650 mg every 4-6 hours as needed for pain.  If pain is severe or moderate, you can apply a heating pad (set on low) or hot water bottle (wrapped in a towel) to outer ear for 20 minutes.  This will also  increase drainage. Avoid ear plugs Do not go swimming until the symptoms are gone Do not use Q-tips After showers, help the water run out by tilting your head to one side.   GET HELP RIGHT AWAY IF: Fever is over 102.2 degrees. You develop progressive ear pain or hearing loss. Ear symptoms persist longer than 3 days after treatment.  MAKE SURE YOU: Understand these instructions. Will watch your condition. Will get help right away if you are not doing well or get worse.  TO PREVENT SWIMMER'S EAR: Use a bathing cap or custom fitted swim molds to keep your ears dry. Towel off after swimming to dry your ears. Tilt your head or pull your earlobes to allow the water to escape your ear canal. If there is still water in your ears, consider using a hairdryer on the lowest setting.  Thank you for choosing an e-visit.  Your e-visit answers were reviewed by a board certified advanced clinical practitioner to complete your personal care plan. Depending upon the condition, your plan could have included both over the counter or prescription medications.  Please review your pharmacy choice. Make sure the pharmacy is open so you can pick up the prescription now. If there is a problem, you may contact your provider  through [employer] and have the prescription routed to another pharmacy.  Your safety is important to [place]. If you have drug allergies check your prescription carefully.   For the next 24 hours you can use MyChart to ask questions about today's visit, request a non-urgent call back, or ask for a work or school excuse. You will get an email with a survey after your eVisit asking about your experience. We would appreciate your feedback. I hope that your e-visit has been valuable and will aid in your recovery.   I spent approximately 5 minutes reviewing the patient's history, current symptoms and coordinating their care today.

## 2023-05-27 ENCOUNTER — Encounter: Payer: Self-pay | Admitting: Neurology

## 2023-06-21 ENCOUNTER — Ambulatory Visit: Payer: Medicaid Other | Attending: Internal Medicine

## 2023-06-21 ENCOUNTER — Other Ambulatory Visit: Payer: Self-pay | Admitting: Internal Medicine

## 2023-06-21 ENCOUNTER — Telehealth: Payer: Self-pay | Admitting: Internal Medicine

## 2023-06-21 ENCOUNTER — Ambulatory Visit: Payer: Medicaid Other | Attending: Internal Medicine | Admitting: Internal Medicine

## 2023-06-21 ENCOUNTER — Encounter: Payer: Self-pay | Admitting: Internal Medicine

## 2023-06-21 VITALS — BP 122/80 | HR 71 | Ht 62.0 in | Wt 150.6 lb

## 2023-06-21 DIAGNOSIS — R42 Dizziness and giddiness: Secondary | ICD-10-CM | POA: Insufficient documentation

## 2023-06-21 DIAGNOSIS — R55 Syncope and collapse: Secondary | ICD-10-CM | POA: Diagnosis not present

## 2023-06-21 NOTE — Patient Instructions (Addendum)
Medication Instructions:  Your physician recommends that you continue on your current medications as directed. Please refer to the Current Medication list given to you today.   Labwork: None  Testing/Procedures: Your physician has recommended that you wear a Zio monitor.   This monitor is a medical device that records the heart's electrical activity. Doctors most often use these monitors to diagnose arrhythmias. Arrhythmias are problems with the speed or rhythm of the heartbeat. The monitor is a small device applied to your chest. You can wear one while you do your normal daily activities. While wearing this monitor if you have any symptoms to push the button and record what you felt. Once you have worn this monitor for the period of time provider prescribed (for 14 days), you will return the monitor device in the postage paid box. Once it is returned they will download the data collected and provide [place] with a report which the provider will then review and we will call you with those results. Important tips:  Avoid showering during the first 24 hours of wearing the monitor. Avoid excessive sweating to help maximize wear time. Do not submerge the device, no hot tubs, and no swimming pools. Keep any lotions or oils away from the patch. After 24 hours you may shower with the patch on. Take brief showers with your back facing the shower head.  Do not remove patch once it has been placed because that will interrupt data and decrease adhesive wear time. Push the button when you have any symptoms and write down what you were feeling. Once you have completed wearing your monitor, remove and place into box which has postage paid and place in your outgoing mailbox.  If for some reason you have misplaced your box then call our office and we can provide another box and/or mail it off for you.  Your physician has requested that you have an exercise tolerance test. For further information please visit  [email or web address] Please also follow instruction sheet, as given.   Follow-Up: Your physician recommends that you schedule a follow-up appointment in: Pending Results  Any Other Special Instructions Will Be Listed Below (If Applicable).  Thank you for choosing [facility]!      If you need a refill on your cardiac medications before your next appointment, please call your pharmacy.

## 2023-06-21 NOTE — Telephone Encounter (Signed)
PERCERT FOR 14  day ZIO

## 2023-06-21 NOTE — Progress Notes (Signed)
Cardiology Office Note  Date: 06/21/2023   ID: Susan Salinas, DOB [birth year], MRN [id number]  PCP:  [facility]  Cardiologist:  None Electrophysiologist:  None   History of Present Illness: Susan Salinas is a 42 y.o. female known to have anxiety, migraines was referred to cardiology clinic for evaluation of dizziness.  She started to have dizziness since June 2024, occurs several times per week, out of nowhere and sudden onset.  Last for some time.  She was told previously that it was her ear issue and was prescribed steroids with significant improvement in symptoms.  However, she had recurrence of similar symptoms.  The symptoms occur most commonly before her migraines.  She says she researched about vestibular migraines.  No syncope.  Orthostatic vitals today negative for orthostatic hypotension and POTS.  No chest pain, SOB, palpitations.  She takes Adderall sometimes and not every day.  Past Medical History:  Diagnosis Date   Abnormal Pap smear of cervix    Anxiety    Contraceptive management 07/31/2013   Depression    History of abnormal cervical Pap smear 07/31/2013   HPV (human papilloma virus) infection    Nausea and vomiting during pregnancy 10/08/2015   Pregnant 10/08/2015    Past Surgical History:  Procedure Laterality Date   COLPOSCOPY      Current Outpatient Medications  Medication Sig Dispense Refill   amphetamine-dextroamphetamine (ADDERALL) 10 MG tablet Take 10 mg by mouth as needed.     cetirizine (ZYRTEC) 10 MG tablet Take 10 mg by mouth as needed for allergies.     clonazePAM (KLONOPIN) 2 MG tablet Take 2 mg by mouth as needed.     fluticasone (FLONASE) 50 MCG/ACT nasal spray Place 1 spray into both nostrils 2 (two) times daily. 16 g 2   hydrOXYzine (ATARAX) 25 MG tablet Take 25 mg by mouth at bedtime.     sertraline (ZOLOFT) 100 MG tablet Take 100 mg by mouth daily.     No current facility-administered medications for this  visit.   Allergies:  Patient has no known allergies.   Social History: The patient  reports that she has never smoked. She has never used smokeless tobacco. She reports that she does not drink alcohol and does not use drugs.   Family History: The patient's family history includes COPD in her paternal grandmother.   ROS:  Please see the history of present illness. Otherwise, complete review of systems is positive for none  All other systems are reviewed and negative.   Physical Exam: VS:  BP 122/80   Pulse 71   Ht 5\' 2"  (1.575 m)   Wt 150 lb 9.6 oz (68.3 kg)   SpO2 92%   BMI 27.55 kg/m , BMI Body mass index is 27.55 kg/m.  Wt Readings from Last 3 Encounters:  06/21/23 150 lb 9.6 oz (68.3 kg)  12/12/18 155 lb (70.3 kg)  11/07/18 150 lb (68 kg)    General: Patient appears comfortable at rest. HEENT: Conjunctiva and lids normal, oropharynx clear with moist mucosa. Neck: Supple, no elevated JVP or carotid bruits, no thyromegaly. Lungs: Clear to auscultation, nonlabored breathing at rest. Cardiac: Regular rate and rhythm, no S3 or significant systolic murmur, no pericardial rub. Abdomen: Soft, nontender, no hepatomegaly, bowel sounds present, no guarding or rebound. Extremities: No pitting edema, distal pulses 2+. Skin: Warm and dry. Musculoskeletal: No kyphosis. Neuropsychiatric: Alert and oriented x3, affect grossly appropriate.  Recent Labwork: No results found for  requested labs within last 365 days.  No results found for: "CHOL", "TRIG", "HDL", "CHOLHDL", "VLDL", "LDLCALC", "LDLDIRECT"   Assessment and Plan:   Dizziness: Ongoing dizziness since June 2024, sudden in onset, occurs before her migraines.  She has a history of ear infections since she was a kid and recently had one that was treated with steroids.  I do not think this is cardiac in etiology.  EKG today showed normal sinus rhythm, short PR interval but no evidence of preexcitation.  Orthostatic vitals today did  not show evidence of orthostatic hypotension or POTS.  Will obtain exercise tolerance test to rule out any exertional arrhythmias and 2-week event monitor to rule out any conduction abnormalities.       Medication Adjustments/Labs and Tests Ordered: Current medicines are reviewed at length with the patient today.  Concerns regarding medicines are outlined above.    Disposition:  Follow up  pending results  Signed Adrien Dietzman Verne Spurr, MD, 06/21/2023 9:42 AM    [facility] at [facility] [address] [place], [place], [place] [address]

## 2023-06-21 NOTE — Telephone Encounter (Signed)
Checking percert on the following patient for testing scheduled at [facility].    GXT  07/06/2023

## 2023-07-06 ENCOUNTER — Ambulatory Visit (HOSPITAL_COMMUNITY): Payer: Medicaid Other | Attending: Internal Medicine

## 2023-07-25 ENCOUNTER — Telehealth: Payer: Self-pay | Admitting: Internal Medicine

## 2023-07-25 NOTE — Telephone Encounter (Signed)
 Patient is requesting her test results from heart monitor.

## 2023-08-03 ENCOUNTER — Ambulatory Visit (HOSPITAL_COMMUNITY): Admission: RE | Admit: 2023-08-03 | Payer: Medicaid Other | Source: Ambulatory Visit

## 2023-08-04 NOTE — Telephone Encounter (Signed)
MyChart message sent to patient.

## 2023-08-30 ENCOUNTER — Telehealth: Payer: Medicaid Other | Admitting: Nurse Practitioner

## 2023-08-30 DIAGNOSIS — B3731 Acute candidiasis of vulva and vagina: Secondary | ICD-10-CM

## 2023-08-30 MED ORDER — FLUCONAZOLE 150 MG PO TABS: 150.0000 mg | ORAL_TABLET | Freq: Once | ORAL | 0 refills | Status: AC

## 2023-08-30 NOTE — Progress Notes (Signed)

## 2023-09-18 ENCOUNTER — Telehealth: Admitting: Physician Assistant

## 2023-09-18 DIAGNOSIS — J329 Chronic sinusitis, unspecified: Secondary | ICD-10-CM

## 2023-09-18 DIAGNOSIS — B9789 Other viral agents as the cause of diseases classified elsewhere: Secondary | ICD-10-CM | POA: Diagnosis not present

## 2023-09-18 MED ORDER — FLUTICASONE PROPIONATE 50 MCG/ACT NA SUSP: 2.0000 | Freq: Every day | NASAL | 0 refills | Status: DC

## 2023-09-18 NOTE — Progress Notes (Signed)
 E-Visit for Sinus Problems  We are sorry that you are not feeling well.  Here is how we plan to help!  Based on what you have shared with me it looks like you have sinusitis.  Sinusitis is inflammation and infection in the sinus cavities of the head.  Based on your presentation I believe you most likely have Acute Viral Sinusitis.This is an infection most likely caused by a virus. There is not specific treatment for viral sinusitis other than to help you with the symptoms until the infection runs its course.  You may use an oral decongestant such as Mucinex D or if you have glaucoma or high blood pressure use plain Mucinex. Saline nasal spray help and can safely be used as often as needed for congestion, I have prescribed: Fluticasone nasal spray two sprays in each nostril once a day  Some authorities believe that zinc sprays or the use of Echinacea may shorten the course of your symptoms.  Sinus infections are not as easily transmitted as other respiratory infection, however we still recommend that you avoid close contact with loved ones, especially the very young and elderly.  Remember to wash your hands thoroughly throughout the day as this is the number one way to prevent the spread of infection!  Home Care: Only take medications as instructed by your medical team. Do not take these medications with alcohol. A steam or ultrasonic humidifier can help congestion.  You can place a towel over your head and breathe in the steam from hot water coming from a faucet. Avoid close contacts especially the very young and the elderly. Cover your mouth when you cough or sneeze. Always remember to wash your hands.  Get Help Right Away If: You develop worsening fever or sinus pain. You develop a severe head ache or visual changes. Your symptoms persist after you have completed your treatment plan.  Make sure you Understand these instructions. Will watch your condition. Will get help right away if you  are not doing well or get worse.   Thank you for choosing an e-visit.  Your e-visit answers were reviewed by a board certified advanced clinical practitioner to complete your personal care plan. Depending upon the condition, your plan could have included both over the counter or prescription medications.  Please review your pharmacy choice. Make sure the pharmacy is open so you can pick up prescription now. If there is a problem, you may contact your provider through [employer] and have the prescription routed to another pharmacy.  Your safety is important to [place]. If you have drug allergies check your prescription carefully.   For the next 24 hours you can use MyChart to ask questions about today's visit, request a non-urgent call back, or ask for a work or school excuse. You will get an email in the next two days asking about your experience. I hope that your e-visit has been valuable and will speed your recovery.   have provided 5 minutes of non face to face time during this encounter for chart review and documentation.

## 2023-10-04 ENCOUNTER — Telehealth: Admitting: Physician Assistant

## 2023-10-04 DIAGNOSIS — T3695XA Adverse effect of unspecified systemic antibiotic, initial encounter: Secondary | ICD-10-CM

## 2023-10-04 DIAGNOSIS — B379 Candidiasis, unspecified: Secondary | ICD-10-CM

## 2023-10-04 MED ORDER — FLUCONAZOLE 150 MG PO TABS: ORAL_TABLET | ORAL | 0 refills | Status: DC

## 2023-10-04 NOTE — Progress Notes (Signed)

## 2023-10-04 NOTE — Progress Notes (Signed)
 I have spent 5 minutes in review of e-visit questionnaire, review and updating patient chart, medical decision making and response to patient.   Piedad Climes, PA-C

## 2023-10-06 ENCOUNTER — Ambulatory Visit: Payer: Medicaid Other | Admitting: Neurology

## 2023-10-07 NOTE — Progress Notes (Deleted)
 NEUROLOGY CONSULTATION NOTE  Susan Salinas MRN: [id number] DOB: Dec 12, 1980  Referring provider: Seward Carol, NP Primary care provider: ***  Reason for consult:  migraines and pre-syncope  Assessment/Plan:   Vestibular migraine  Migraine prevention:  *** Migraine rescue:  *** Limit use of pain relievers to no more than 2 days out of week to prevent risk of rebound or medication-overuse headache. Keep headache diary Follow up ***    Subjective:  Susan Salinas is a 43 year old ***-handed female with depression and anxiety who presents for migraines and syncope.  History supplemented by cardiology and referring provider's notes.  Migraines: ***  Pre--syncope: In June 2024, ***.  They can last ***.  She was evaluated by cardiology.  Orthostatic vitals were negative.  Cardiac event monitor captured clinical event with no correlation on the monitor.    Past NSAIDS/analgesics:  *** Past abortive triptans:  *** Past abortive ergotamine:  *** Past muscle relaxants:  *** Past anti-emetic:  *** Past antihypertensive medications:  *** Past antidepressant medications:  *** Past anticonvulsant medications:  *** Past anti-CGRP:  *** Past vitamins/Herbal/Supplements:  *** Past antihistamines/decongestants:  Sudafed, Zyrtec Other past therapies:  ***  Current NSAIDS/analgesics:  *** Current triptans:  sumatriptan 5mg  Current ergotamine:  *** Current anti-emetic:  *** Current muscle relaxants:  *** Current Antihypertensive medications:  *** Current Antidepressant medications:  escitalopram 20mg  daily Current Anticonvulsant medications:  *** Current anti-CGRP:  *** Current Vitamins/Herbal/Supplements:  *** Current Antihistamines/Decongestants:  meclizineFlonase Other therapy:  *** Birth control:  *** Other medications:  Adderall, clonazepam 2mg    Caffeine:  *** Alcohol:  *** Smoker:  *** Diet:  *** Exercise:  *** Depression:  ***; Anxiety:  *** Other pain:   *** Sleep hygiene:  *** Family history of headache:  ***      PAST MEDICAL HISTORY: Past Medical History:  Diagnosis Date   Abnormal Pap smear of cervix    Anxiety    Contraceptive management 07/31/2013   Depression    History of abnormal cervical Pap smear 07/31/2013   HPV (human papilloma virus) infection    Nausea and vomiting during pregnancy 10/08/2015   Pregnant 10/08/2015    PAST SURGICAL HISTORY: Past Surgical History:  Procedure Laterality Date   COLPOSCOPY      MEDICATIONS: Current Outpatient Medications on File Prior to Visit  Medication Sig Dispense Refill   amphetamine-dextroamphetamine (ADDERALL) 10 MG tablet Take 10 mg by mouth as needed.     cetirizine (ZYRTEC) 10 MG tablet Take 10 mg by mouth as needed for allergies.     clonazePAM (KLONOPIN) 2 MG tablet Take 2 mg by mouth as needed.     fluconazole (DIFLUCAN) 150 MG tablet Take 1 tablet PO once. Repeat in 3 days if needed. 2 tablet 0   fluticasone (FLONASE) 50 MCG/ACT nasal spray Place 1 spray into both nostrils 2 (two) times daily. 16 g 2   fluticasone (FLONASE) 50 MCG/ACT nasal spray Place 2 sprays into both nostrils daily. 16 g 0   hydrOXYzine (ATARAX) 25 MG tablet Take 25 mg by mouth at bedtime.     sertraline (ZOLOFT) 100 MG tablet Take 100 mg by mouth daily.     No current facility-administered medications on file prior to visit.    ALLERGIES: No Known Allergies  FAMILY HISTORY: Family History  Problem Relation Age of Onset   COPD Paternal Grandmother     Objective:  *** General: No acute distress.  Patient appears well-groomed.   Head:  Normocephalic/atraumatic  Eyes:  fundi examined but not visualized Neck: supple, no paraspinal tenderness, full range of motion Back: No paraspinal tenderness Heart: regular rate and rhythm Lungs: Clear to auscultation bilaterally. Vascular: No carotid bruits. Neurological Exam: Mental status: alert and oriented to person, place, and time, speech fluent  and not dysarthric, language intact. Cranial nerves: CN I: not tested CN II: pupils equal, round and reactive to light, visual fields intact CN III, IV, VI:  full range of motion, no nystagmus, no ptosis CN V: facial sensation intact. CN VII: upper and lower face symmetric CN VIII: hearing intact CN IX, X: gag intact, uvula midline CN XI: sternocleidomastoid and trapezius muscles intact CN XII: tongue midline Bulk & Tone: normal, no fasciculations. Motor:  muscle strength 5/5 throughout Sensation:  Pinprick, temperature and vibratory sensation intact. Deep Tendon Reflexes:  2+ throughout,  toes downgoing.   Finger to nose testing:  Without dysmetria.   Heel to shin:  Without dysmetria.   Gait:  Normal station and stride.  Romberg negative.    Thank you for allowing me to take part in the care of this patient.  Shon Millet, DO  CC: ***

## 2023-10-10 ENCOUNTER — Encounter: Payer: Self-pay | Admitting: Neurology

## 2023-10-10 ENCOUNTER — Ambulatory Visit: Payer: Medicaid Other | Admitting: Neurology

## 2023-10-11 ENCOUNTER — Telehealth: Admitting: Physician Assistant

## 2023-10-11 DIAGNOSIS — R3989 Other symptoms and signs involving the genitourinary system: Secondary | ICD-10-CM | POA: Diagnosis not present

## 2023-10-11 MED ORDER — NITROFURANTOIN MONOHYD MACRO 100 MG PO CAPS: 100.0000 mg | ORAL_CAPSULE | Freq: Two times a day (BID) | ORAL | 0 refills | Status: AC

## 2023-10-11 NOTE — Progress Notes (Signed)

## 2023-12-12 ENCOUNTER — Telehealth: Admitting: Family Medicine

## 2023-12-12 DIAGNOSIS — H109 Unspecified conjunctivitis: Secondary | ICD-10-CM

## 2023-12-12 MED ORDER — POLYMYXIN B-TRIMETHOPRIM 10000-0.1 UNIT/ML-% OP SOLN: 2.0000 [drp] | Freq: Four times a day (QID) | OPHTHALMIC | 0 refills | Status: AC

## 2023-12-12 NOTE — Progress Notes (Signed)
E-Visit for [employer]   We are sorry that you are not feeling well.  Here is how we plan to help!  Based on what you have shared with me it looks like you have conjunctivitis.  Conjunctivitis is a common inflammatory or infectious condition of the eye that is often referred to as "pink eye".  In most cases it is contagious (viral or bacterial). However, not all conjunctivitis requires antibiotics (ex. Allergic).  We have made appropriate suggestions for you based upon your presentation.  I have prescribed Polytrim Ophthalmic drops 1-2 drops 4 times a day times 5 days  Pink eye can be highly contagious.  It is typically spread through direct contact with secretions, or contaminated objects or surfaces that one may have touched.  Strict handwashing is suggested with soap and water is urged.  If not available, use alcohol based had sanitizer.  Avoid unnecessary touching of the eye.  If you wear contact lenses, you will need to refrain from wearing them until you see no white discharge from the eye for at least 24 hours after being on medication.  You should see symptom improvement in 1-2 days after starting the medication regimen.  Call us if symptoms are not improved in 1-2 days.  Home Care: Wash your hands often! Do not wear your contacts until you complete your treatment plan. Avoid sharing towels, bed linen, personal items with a person who has pink eye. See attention for anyone in your home with similar symptoms.  Get Help Right Away If: Your symptoms do not improve. You develop blurred or loss of vision. Your symptoms worsen (increased discharge, pain or redness)   Thank you for choosing an e-visit.  Your e-visit answers were reviewed by a board certified advanced clinical practitioner to complete your personal care plan. Depending upon the condition, your plan could have included both over the counter or prescription medications.  Please review your pharmacy choice. Make sure the  pharmacy is open so you can pick up prescription now. If there is a problem, you may contact your provider through [employer] and have the prescription routed to another pharmacy.  Your safety is important to [place]. If you have drug allergies check your prescription carefully.   For the next 24 hours you can use MyChart to ask questions about today's visit, request a non-urgent call back, or ask for a work or school excuse. You will get an email in the next two days asking about your experience. I hope that your e-visit has been valuable and will speed your recovery.  I have spent 5 minutes in review of e-visit questionnaire, review and updating patient chart, medical decision making and response to patient.   Reed Pandy, PA-C

## 2023-12-22 ENCOUNTER — Telehealth: Admitting: Family Medicine

## 2023-12-22 DIAGNOSIS — R3989 Other symptoms and signs involving the genitourinary system: Secondary | ICD-10-CM

## 2023-12-22 NOTE — Progress Notes (Signed)
  Because it has not been 3 months since your last UTI treatment with us , you will need to be seen in person so that at sample can be collected. Your condition warrants further evaluation and we recommend that you be seen in a face-to-face visit at your PCP office or local urgent care.   NOTE: There will be NO CHARGE for this E-Visit   If you are having a true medical emergency, please call 911.

## 2023-12-27 ENCOUNTER — Telehealth: Admitting: Physician Assistant

## 2023-12-27 DIAGNOSIS — B9689 Other specified bacterial agents as the cause of diseases classified elsewhere: Secondary | ICD-10-CM

## 2023-12-27 DIAGNOSIS — J019 Acute sinusitis, unspecified: Secondary | ICD-10-CM

## 2023-12-28 MED ORDER — AMOXICILLIN-POT CLAVULANATE 875-125 MG PO TABS: 1.0000 | ORAL_TABLET | Freq: Two times a day (BID) | ORAL | 0 refills | Status: DC

## 2023-12-28 NOTE — Progress Notes (Signed)

## 2024-01-02 ENCOUNTER — Other Ambulatory Visit (HOSPITAL_COMMUNITY): Payer: Self-pay | Admitting: Obstetrics & Gynecology

## 2024-01-02 DIAGNOSIS — Z1231 Encounter for screening mammogram for malignant neoplasm of breast: Secondary | ICD-10-CM

## 2024-01-09 ENCOUNTER — Ambulatory Visit (HOSPITAL_COMMUNITY)

## 2024-01-22 ENCOUNTER — Telehealth

## 2024-01-22 DIAGNOSIS — H5789 Other specified disorders of eye and adnexa: Secondary | ICD-10-CM

## 2024-01-22 NOTE — Progress Notes (Signed)
  Because you are already using antibiotics in your eye and do not feel you are getting better, I am not able to help you by Evisit and I feel your condition warrants further evaluation and I recommend that you be seen in a face-to-face visit.   NOTE: There will be NO CHARGE for this E-Visit   If you are having a true medical emergency, please call 911.     For an urgent face to face visit, [facility] has multiple urgent care centers for your convenience.  Click the link below for the full list of locations and hours, walk-in wait times, appointment scheduling options and driving directions:  Urgent Care - [place], [place], [place], [place], [place], [place]  [place]     Your MyChart E-visit questionnaire answers were reviewed by a board certified advanced clinical practitioner to complete your personal care plan based on your specific symptoms.    Thank you for using e-Visits.

## 2024-02-02 ENCOUNTER — Encounter: Payer: Self-pay | Admitting: Neurology

## 2024-02-27 ENCOUNTER — Telehealth: Admitting: Physician Assistant

## 2024-02-27 DIAGNOSIS — A084 Viral intestinal infection, unspecified: Secondary | ICD-10-CM | POA: Diagnosis not present

## 2024-02-27 MED ORDER — ONDANSETRON 4 MG PO TBDP: 4.0000 mg | ORAL_TABLET | Freq: Three times a day (TID) | ORAL | 0 refills | Status: DC | PRN

## 2024-02-27 NOTE — Progress Notes (Signed)

## 2024-05-16 ENCOUNTER — Telehealth: Admitting: Physician Assistant

## 2024-05-16 DIAGNOSIS — H1031 Unspecified acute conjunctivitis, right eye: Secondary | ICD-10-CM | POA: Diagnosis not present

## 2024-05-16 MED ORDER — POLYMYXIN B-TRIMETHOPRIM 10000-0.1 UNIT/ML-% OP SOLN: OPHTHALMIC | 0 refills | Status: AC

## 2024-05-16 NOTE — Progress Notes (Signed)
 We are sorry that you are not feeling well.  Here is how we plan to help!  Based on what you have shared with me it looks like you have conjunctivitis.  Conjunctivitis is a common inflammatory or infectious condition of the eye that is often referred to as pink eye.  In most cases it is contagious (viral or bacterial). However, not all conjunctivitis requires antibiotics (ex. Allergic).  We have made appropriate suggestions for you based upon your presentation.  I am concerned about some ongoing allergic conjunctivitis, now with a secondary bacterial infection in the right eye.   I have prescribed Polytrim  Ophthalmic drops 1-2 drops 4 times a day times 5 days and I recommend that you use OpconA, 1-2 drops every 4-6 hours (an over the counter allergy drop available at your local pharmacy).  Your pharmacist may have an alternative suggestion.  Pink eye can be highly contagious.  It is typically spread through direct contact with secretions, or contaminated objects or surfaces that one may have touched.  Strict handwashing is suggested with soap and water is urged.  If not available, use alcohol based had sanitizer.  Avoid unnecessary touching of the eye.  If you wear contact lenses, you will need to refrain from wearing them until you see no white discharge from the eye for at least 24 hours after being on medication.  You should see symptom improvement in 1-2 days after starting the medication regimen.  Call us  if symptoms are not improved in 1-2 days.  Home Care: Wash your hands often! Do not wear your contacts until you complete your treatment plan. Avoid sharing towels, bed linen, personal items with a person who has pink eye. See attention for anyone in your home with similar symptoms.  Get Help Right Away If: Your symptoms do not improve. You develop blurred or loss of vision. Your symptoms worsen (increased discharge, pain or redness)  Your e-visit answers were reviewed by a board  certified advanced clinical practitioner to complete your personal care plan.  Depending on the condition, your plan could have included both over the counter or prescription medications.  If there is a problem please reply  once you have received a response from your provider.  Your safety is important to us .  If you have drug allergies check your prescription carefully.    You can use MyChart to ask questions about today's visit, request a non-urgent call back, or ask for a work or school excuse for 24 hours related to this e-Visit. If it has been greater than 24 hours you will need to follow up with your provider, or enter a new e-Visit to address those concerns.   You will get an e-mail in the next two days asking about your experience.  I hope that your e-visit has been valuable and will speed your recovery. Thank you for using e-visits.  I have spent 5 minutes in review of e-visit questionnaire, review and updating patient chart, medical decision making and response to patient.   Elsie Velma Lunger, PA-C

## 2024-06-07 ENCOUNTER — Ambulatory Visit: Admitting: Neurology

## 2024-06-18 NOTE — Progress Notes (Unsigned)
 NEUROLOGY CONSULTATION NOTE  Susan Salinas MRN: [id number] DOB: 24-Nov-1980  Referring provider: Silvio Ramp, FNP Primary care provider: Geni Clause, FNP  Reason for consult:  headaches  Assessment/Plan:   Consider migraine with aura, without status migrainosus, intractable  Check MRI of brain with and without contrast (open MRI due to claustrophobia) Advised to start the venlafaxine ER 37.5mg  daily.  If no improvement in 4 weeks, increase to 75mg  daily.  If no improvement after another 2 months, would start a CGRP inhibitor Sumatriptan 50mg  as needed.  Discontinue ibuprofen .  Limit use of pain relievers to no more than 9 days out of the month to prevent risk of rebound or medication-overuse headache. Keep headache diary Follow up 6 months.  Total time spent in chart and face to face with patient:  58 minutes   Subjective:  Susan Salinas is a 43 year old female who presents for headache.  History supplemented by referring provider's note.  Discussed the use of AI scribe software for clinical note transcription with the patient, who gave verbal consent to proceed.  History of Present Illness Susan Salinas is a 43 year old female with migraines who presents with persistent migraine symptoms and new-onset vertigo-like episodes.  She has experienced migraines since the age of 22, trying various treatments including Topamax, which caused adverse effects, and Imitrex, which has been effective. She also received Toradol shots in the past.  They are pounding right frontal pain with nausea, photophobia, bilateral arm heaviness, brain fog and neck pain.  They significantly improved since starting Mirena.    Recently, there has been a change in her migraine pattern, with less severe pain but persistent symptoms such as light sensitivity and a sensation of arm heaviness. Her migraine headaches, when they occur, are typically right-sided and pounding, with associated nausea but not to the  point of vomiting. She takes Imitrex 50 mg, often splitting the dose to manage her symptoms. She also uses ibuprofen  frequently, taking it almost daily since the summer of 2025.  In the summer of 2025, she experienced a sudden onset of vertigo-like symptoms while on vacation, described as feeling 'drunk' with headaches and panic. These episodes are different than her typical migraines as they are less severe headache and, in addition to her typical migraine symptoms, also associated with sensation of spinning, sinus pressure, flushing, ear fullness, and occasional ringing in the ears.  She describes these episodes as a 'whoosh' sensation and sometimes feels like she might pass out.  They may occur a couple of days and then 3 days without symptoms.  She experiences about 14 days a month.  She has her habitual severe migraine headaches about once a month that respond to sumatriptan.  Heat tends to be a trigger.  These episodes are not associated with chest pain or heart palpitations. Previous cardiac evaluation showed no abnormalities despite episodes of lightheadedness and flushing. She experiences episodes of lightheadedness and flushing, which occur unpredictably and last a few minutes. ENT evaluation unremarkable.   She has been prescribed Effexor for anxiety and potential vestibular migraine, but has only taken it once due to concerns about side effects. She also has propranolol prescribed for anxiety, which she takes as needed. She is active, with three children, and reports that her symptoms have significantly impacted her life, causing her to avoid activities and feel depressed about her health issues. Her sleep is variable, with good sleep for a few days followed by restless nights. She does not consume much caffeine  and is trying to increase her water intake. She is physically active but does not engage in structured exercise.  She has a family history of headaches, with her cousin and son also  experiencing them.  Past NSAIDS/analgesics:  Tylenol  Past abortive triptans:  none Past abortive ergotamine:  none Past muscle relaxants:  none Past anti-emetic:  Zofran , promethazine  Past antihypertensive medications:  none Past antidepressant medications:  sertraline, citalopram  Past anticonvulsant medications:  topiramate (intolerance) Past anti-CGRP:  none Past vitamins/Herbal/Supplements:  none Past antihistamines/decongestants:  meclizine , Benadryl  Other past therapies:  none  Current NSAIDS/analgesics:  ibuprofen  Current triptans:  sumatriptan 50mg  Current ergotamine:  none Current anti-emetic:  none Current muscle relaxants:  none Current Antihypertensive medications:  propranolol 10mg  Current Antidepressant medications:  venlafaxine ER 37.5mg  daily Current Anticonvulsant medications:  none Current anti-CGRP:  none Current Vitamins/Herbal/Supplements:  none Current Antihistamines/Decongestants:  hydroxyzine, Zyrtec , Flonase  Other therapy:  none Birth control:  Mirena Other medications:  clonazepam       PAST MEDICAL HISTORY: Past Medical History:  Diagnosis Date   Abnormal Pap smear of cervix    Anxiety    Contraceptive management 07/31/2013   Depression    History of abnormal cervical Pap smear 07/31/2013   HPV (human papilloma virus) infection    Nausea and vomiting during pregnancy 10/08/2015   Pregnant 10/08/2015    PAST SURGICAL HISTORY: Past Surgical History:  Procedure Laterality Date   COLPOSCOPY      MEDICATIONS: Current Outpatient Medications on File Prior to Visit  Medication Sig Dispense Refill   cetirizine  (ZYRTEC ) 10 MG tablet Take 10 mg by mouth as needed for allergies.     clonazePAM  (KLONOPIN ) 2 MG tablet Take 2 mg by mouth as needed.     fluticasone  (FLONASE ) 50 MCG/ACT nasal spray Place 1 spray into both nostrils 2 (two) times daily. 16 g 2   hydrOXYzine (ATARAX) 25 MG tablet Take 25 mg by mouth at bedtime.     nitrofurantoin ,  macrocrystal-monohydrate, (MACROBID ) 100 MG capsule Take 1 capsule (100 mg total) by mouth 2 (two) times daily. 10 capsule 0   sertraline (ZOLOFT) 100 MG tablet Take 100 mg by mouth daily.     trimethoprim -polymyxin b  (POLYTRIM ) ophthalmic solution Apply 1-2 drops into affected eye QID x 5 days. 10 mL 0   No current facility-administered medications on file prior to visit.    ALLERGIES: No Known Allergies  FAMILY HISTORY: Family History  Problem Relation Age of Onset   COPD Paternal Grandmother     Objective:  Blood pressure 115/81, pulse 84, height 5' 2 (1.575 m), weight 146 lb (66.2 kg), SpO2 92%. General: No acute distress.  Patient appears well-groomed.   Head:  Normocephalic/atraumatic Eyes:  fundi examined but not visualized Neck: supple, no paraspinal tenderness, full range of motion Heart: regular rate and rhythm Neurological Exam: Mental status: alert and oriented to person, place, and time, speech fluent and not dysarthric, language intact. Cranial nerves: CN I: not tested CN II: pupils equal, round and reactive to light, visual fields intact CN III, IV, VI:  full range of motion, no nystagmus, no ptosis CN V: facial sensation intact. CN VII: upper and lower face symmetric CN VIII: hearing intact CN IX, X: gag intact, uvula midline CN XI: sternocleidomastoid and trapezius muscles intact CN XII: tongue midline Bulk & Tone: normal, no fasciculations. Motor:  muscle strength 5/5 throughout Sensation:  Pinprick and vibratory sensation intact. Deep Tendon Reflexes:  2+ throughout,  toes downgoing.   Finger to  nose testing:  Without dysmetria.   Gait:  Normal station and stride.  Romberg negative.    Thank you for allowing me to take part in the care of this patient.  Juliene Dunnings, DO  CC:  Geni Clause, FNP  Silvio Ramp, FNP

## 2024-06-19 ENCOUNTER — Ambulatory Visit: Admitting: Neurology

## 2024-06-19 ENCOUNTER — Encounter: Payer: Self-pay | Admitting: Neurology

## 2024-06-19 VITALS — BP 115/81 | HR 84 | Ht 62.0 in | Wt 146.0 lb

## 2024-06-19 DIAGNOSIS — R42 Dizziness and giddiness: Secondary | ICD-10-CM

## 2024-06-19 DIAGNOSIS — G43119 Migraine with aura, intractable, without status migrainosus: Secondary | ICD-10-CM

## 2024-06-19 NOTE — Patient Instructions (Signed)
  MRI of brain with and without contrast - open MRI at [facility] Start the Effexor.  Contact us  in 4 weeks with update and we can increase dose if needed. Take sumatriptan at earliest onset of headache.  May repeat dose once in 2 hours if needed.  Maximum 2 tablets in 24 hours. Limit use of pain relievers to no more than 9 days out of the month.  These medications include acetaminophen , NSAIDs (ibuprofen /Advil /Motrin , naproxen/Aleve, triptans (Imitrex/sumatriptan), Excedrin, and narcotics.  This will help reduce risk of rebound headaches. Be aware of common food triggers Routine exercise Stay adequately hydrated (aim for 64 oz water daily) Keep headache diary Maintain proper stress management Maintain proper sleep hygiene Do not skip meals Consider supplements:  magnesium citrate 400mg  daily, riboflavin 400mg  daily, coenzyme Q10 100mg  three times daily.

## 2024-08-02 ENCOUNTER — Telehealth: Admitting: Physician Assistant

## 2024-08-02 DIAGNOSIS — B9789 Other viral agents as the cause of diseases classified elsewhere: Secondary | ICD-10-CM | POA: Diagnosis not present

## 2024-08-02 DIAGNOSIS — J019 Acute sinusitis, unspecified: Secondary | ICD-10-CM | POA: Diagnosis not present

## 2024-08-02 MED ORDER — PREDNISONE 10 MG (21) PO TBPK: ORAL_TABLET | ORAL | 0 refills | Status: AC

## 2024-08-02 NOTE — Progress Notes (Signed)
 We are sorry that you are not feeling well.  Here is how we plan to help!  Based on what you have shared with me it looks like you have sinusitis.  Sinusitis is inflammation and infection in the sinus cavities of the head.  Based on your presentation I believe you most likely have Acute Viral Sinusitis.This is an infection most likely caused by a virus. There is not specific treatment for viral sinusitis other than to help you with the symptoms until the infection runs its course.  You may use an oral decongestant such as Mucinex D or if you have glaucoma or high blood pressure use plain Mucinex. Saline nasal spray help and can safely be used as often as needed for congestion, I have prescribed: Ipratropium Bromide nasal spray 0.03% 2 sprays in eah nostril 2-3 times a day.  I have also sent in a steroid pack to take as directed.  Some authorities believe that zinc sprays or the use of Echinacea may shorten the course of your symptoms.  Sinus infections are not as easily transmitted as other respiratory infection, however we still recommend that you avoid close contact with loved ones, especially the very young and elderly.  Remember to wash your hands thoroughly throughout the day as this is the number one way to prevent the spread of infection!  Home Care: Only take medications as instructed by your medical team. Do not take these medications with alcohol. A steam or ultrasonic humidifier can help congestion.  You can place a towel over your head and breathe in the steam from hot water coming from a faucet. Avoid close contacts especially the very young and the elderly. Cover your mouth when you cough or sneeze. Always remember to wash your hands.  Get Help Right Away If: You develop worsening fever or sinus pain. You develop a severe head ache or visual changes. Your symptoms persist after you have completed your treatment plan.  Make sure you Understand these instructions. Will watch your  condition. Will get help right away if you are not doing well or get worse.  Your e-visit answers were reviewed by a board certified advanced clinical practitioner to complete your personal care plan.  Depending on the condition, your plan could have included both over the counter or prescription medications.  If there is a problem please reply  once you have received a response from your provider.  Your safety is important to us .  If you have drug allergies check your prescription carefully.    You can use MyChart to ask questions about todays visit, request a non-urgent call back, or ask for a work or school excuse for 24 hours related to this e-Visit. If it has been greater than 24 hours you will need to follow up with your provider, or enter a new e-Visit to address those concerns.  You will get an e-mail in the next two days asking about your experience.  I hope that your e-visit has been valuable and will speed your recovery. Thank you for using e-visits.  I have spent 5 minutes in review of e-visit questionnaire, review and updating patient chart, medical decision making and response to patient.   Elsie Velma Lunger, PA-C

## 2024-08-10 ENCOUNTER — Telehealth: Admitting: Physician Assistant

## 2024-08-10 DIAGNOSIS — R3989 Other symptoms and signs involving the genitourinary system: Secondary | ICD-10-CM

## 2024-08-10 MED ORDER — CEPHALEXIN 500 MG PO CAPS: 500.0000 mg | ORAL_CAPSULE | Freq: Two times a day (BID) | ORAL | 0 refills | Status: AC

## 2024-08-10 NOTE — Progress Notes (Signed)

## 2024-08-10 NOTE — Progress Notes (Signed)
 Message sent to patient requesting further input regarding current symptoms. Awaiting patient response.

## 2024-08-23 NOTE — Progress Notes (Unsigned)
 "  Office Visit Note  Patient: Susan Salinas             Date of Birth: June 06, 1981           MRN: [id number]             PCP: Myra Geni ORN, FNP Referring: Myra Geni ORN, FNP Visit Date: 09/06/2024 Occupation: Data Unavailable  Subjective:  No chief complaint on file.   History of Present Illness: Susan Salinas is a 44 y.o. female ***     Activities of Daily Living:  Patient reports morning stiffness for *** {minute/hour:19697}.   Patient {ACTIONS;DENIES/REPORTS:21021675::Denies} nocturnal pain.  Difficulty dressing/grooming: {ACTIONS;DENIES/REPORTS:21021675::Denies} Difficulty climbing stairs: {ACTIONS;DENIES/REPORTS:21021675::Denies} Difficulty getting out of chair: {ACTIONS;DENIES/REPORTS:21021675::Denies} Difficulty using hands for taps, buttons, cutlery, and/or writing: {ACTIONS;DENIES/REPORTS:21021675::Denies}  No Rheumatology ROS completed.   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/15/2013    Past Medical History:  Diagnosis Date   Abnormal Pap smear of cervix    Anxiety    Contraceptive management 07/31/2013   Depression    History of abnormal cervical Pap smear 07/31/2013   HPV (human papilloma virus) infection    Nausea and vomiting during pregnancy 10/08/2015   Pregnant 10/08/2015    Family History  Problem Relation Age of Onset   COPD Paternal Grandmother    Migraines Cousin    Past Surgical History:  Procedure Laterality Date   COLPOSCOPY     Social History[1] Social History   Social History Narrative   Right handed     Immunization History  Administered Date(s) Administered   Rho (D) Immune Globulin  11/07/2018   Tdap 11/07/2018     Objective: Vital  Signs: There were no vitals taken for this visit.   Physical Exam   Musculoskeletal Exam: ***  CDAI Exam: CDAI Score: -- Patient Global: --; Provider Global: -- Swollen: --; Tender: -- Joint Exam 09/06/2024   No joint exam has been documented for this visit   There is currently no information documented on the homunculus. Go to the Rheumatology activity and complete the homunculus joint exam.  Investigation: No additional findings.  Imaging: No results found.  Recent Labs: Lab Results  Component Value Date   WBC 9.4 10/10/2018   HGB 11.0 (L) 10/10/2018   PLT 201 10/10/2018    Speciality Comments: No specialty comments available.  Procedures:  No procedures performed Allergies: Patient has no known allergies.   Assessment / Plan:     Visit Diagnoses: No diagnosis found.  Orders: No orders of the defined types were placed in this encounter.  No orders of the defined types were placed in this encounter.   Face-to-face time spent with patient was *** minutes. Greater than 50% of time was spent in counseling and coordination of care.  Follow-Up Instructions: No follow-ups on file.   Alfonso Patterson, LPN  Note - This record has been created using [employer].  Chart creation errors have been sought, but may not always  have been located. Such creation errors do not reflect on  the standard of medical care.    [1]  Social History Tobacco Use   Smoking status: Never   Smokeless tobacco: Never  Vaping Use   Vaping status:  Never Used  Substance Use Topics   Alcohol use: No   Drug use: No   "

## 2024-09-06 ENCOUNTER — Ambulatory Visit

## 2024-12-21 ENCOUNTER — Ambulatory Visit: Admitting: Neurology
